# Patient Record
Sex: Female | Born: 1985 | Race: Black or African American | Hispanic: No | Marital: Single | Smoking: Current some day smoker
Health system: Southern US, Community
[De-identification: ages and names within clinical notes are randomized; demographics above are authoritative.]

## PROBLEM LIST (undated history)

## (undated) DIAGNOSIS — I10 Essential (primary) hypertension: Secondary | ICD-10-CM

## (undated) HISTORY — PX: TUBAL LIGATION: SHX77

---
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
## 2006-03-03 ENCOUNTER — Emergency Department: Payer: Self-pay | Admitting: Emergency Medicine

## 2007-01-06 ENCOUNTER — Emergency Department: Payer: Self-pay | Admitting: Emergency Medicine

## 2007-11-05 ENCOUNTER — Emergency Department: Payer: Self-pay | Admitting: Emergency Medicine

## 2008-05-15 ENCOUNTER — Emergency Department: Payer: Self-pay | Admitting: Emergency Medicine

## 2008-09-04 ENCOUNTER — Ambulatory Visit: Payer: Self-pay | Admitting: Advanced Practice Midwife

## 2008-10-23 ENCOUNTER — Observation Stay: Payer: Self-pay

## 2008-12-04 ENCOUNTER — Observation Stay: Payer: Self-pay | Admitting: Obstetrics and Gynecology

## 2008-12-06 ENCOUNTER — Observation Stay: Payer: Self-pay

## 2009-01-09 ENCOUNTER — Observation Stay: Payer: Self-pay

## 2009-01-21 ENCOUNTER — Observation Stay: Payer: Self-pay

## 2009-01-22 ENCOUNTER — Inpatient Hospital Stay: Payer: Self-pay

## 2009-07-21 ENCOUNTER — Emergency Department: Payer: Self-pay | Admitting: Internal Medicine

## 2009-11-24 ENCOUNTER — Inpatient Hospital Stay: Payer: Self-pay

## 2010-01-10 ENCOUNTER — Ambulatory Visit: Payer: Self-pay | Admitting: Obstetrics and Gynecology

## 2010-01-15 ENCOUNTER — Ambulatory Visit: Payer: Self-pay | Admitting: Obstetrics and Gynecology

## 2011-06-08 ENCOUNTER — Emergency Department: Payer: Self-pay | Admitting: Emergency Medicine

## 2011-10-27 ENCOUNTER — Emergency Department: Payer: Self-pay | Admitting: Emergency Medicine

## 2012-05-15 ENCOUNTER — Emergency Department: Payer: Self-pay | Admitting: Emergency Medicine

## 2012-05-15 LAB — CBC WITH DIFFERENTIAL/PLATELET
Basophil #: 0 10*3/uL (ref 0.0–0.1)
Basophil %: 0.4 %
Eosinophil #: 0.1 10*3/uL (ref 0.0–0.7)
Eosinophil %: 0.5 %
MCHC: 33.2 g/dL (ref 32.0–36.0)
Monocyte #: 0.4 x10 3/mm (ref 0.2–0.9)
Neutrophil #: 11.2 10*3/uL — ABNORMAL HIGH (ref 1.4–6.5)
Neutrophil %: 92.5 %
RBC: 4.58 10*6/uL (ref 3.80–5.20)
RDW: 14.7 % — ABNORMAL HIGH (ref 11.5–14.5)
WBC: 12.1 10*3/uL — ABNORMAL HIGH (ref 3.6–11.0)

## 2012-05-15 LAB — BASIC METABOLIC PANEL
BUN: 4 mg/dL — ABNORMAL LOW (ref 7–18)
Calcium, Total: 9 mg/dL (ref 8.5–10.1)
Chloride: 104 mmol/L (ref 98–107)
Co2: 24 mmol/L (ref 21–32)
EGFR (African American): >60
Osmolality: 274 (ref 275–301)
Potassium: 3.3 mmol/L — ABNORMAL LOW (ref 3.5–5.1)

## 2012-05-21 LAB — CULTURE, BLOOD (SINGLE)

## 2013-04-17 ENCOUNTER — Emergency Department: Payer: Self-pay | Admitting: Emergency Medicine

## 2014-07-19 ENCOUNTER — Emergency Department: Payer: Self-pay | Admitting: Emergency Medicine

## 2015-10-17 ENCOUNTER — Emergency Department
Admission: EM | Admit: 2015-10-17 | Discharge: 2015-10-17 | Disposition: A | Discharge status: Home / Self Care | Payer: Self-pay | Attending: Emergency Medicine | Admitting: Emergency Medicine

## 2015-10-17 ENCOUNTER — Encounter: Payer: Self-pay | Admitting: Emergency Medicine

## 2015-10-17 DIAGNOSIS — L02411 Cutaneous abscess of right axilla: Secondary | ICD-10-CM | POA: Insufficient documentation

## 2015-10-17 DIAGNOSIS — L02419 Cutaneous abscess of limb, unspecified: Secondary | ICD-10-CM

## 2015-10-17 DIAGNOSIS — Z720 Tobacco use: Secondary | ICD-10-CM | POA: Insufficient documentation

## 2015-10-17 MED ORDER — OXYCODONE HCL 5 MG PO TABS
5.0000 mg | ORAL_TABLET | Freq: Once | ORAL | Status: AC
  Administered 2015-10-17: 5 mg via ORAL
  Filled 2015-10-17: qty 1

## 2015-10-17 MED ORDER — HYDROCODONE-ACETAMINOPHEN 5-325 MG PO TABS: 1.0000 | ORAL_TABLET | ORAL | Status: DC | PRN

## 2015-10-17 MED ORDER — SULFAMETHOXAZOLE-TRIMETHOPRIM 800-160 MG PO TABS: 1.0000 | ORAL_TABLET | Freq: Two times a day (BID) | ORAL | Status: DC

## 2015-10-17 MED ORDER — DIAZEPAM 2 MG PO TABS
2.0000 mg | ORAL_TABLET | Freq: Once | ORAL | Status: AC
  Administered 2015-10-17: 2 mg via ORAL
  Filled 2015-10-17: qty 1

## 2015-10-17 MED ORDER — LIDOCAINE-EPINEPHRINE (PF) 1 %-1:200000 IJ SOLN
30.0000 mL | Freq: Once | INTRAMUSCULAR | Status: AC
  Administered 2015-10-17: 30 mL
  Filled 2015-10-17: qty 30

## 2015-10-17 NOTE — ED Provider Notes (Signed)
[facility] Emergency Department Provider Note ____________________________________________  Time seen: Approximately 4:59 PM  I have reviewed the triage vital signs and the nursing notes.   HISTORY  Chief Complaint Abscess   HPI ARLOA PRAK is a 29 y.o. female who presents to the emergency department for evaluation of abscess under right axilla. Symptoms have been present for 2 days. She reports a history of the same. She denies fever.  History reviewed. No pertinent past medical history.  There are no active problems to display for this patient.   History reviewed. No pertinent past surgical history.  Current Outpatient Rx  Name  Route  Sig  Dispense  Refill  . HYDROcodone-acetaminophen (NORCO/VICODIN) 5-325 MG tablet   Oral   Take 1 tablet by mouth every 4 (four) hours as needed.   12 tablet   0   . sulfamethoxazole-trimethoprim (BACTRIM DS,SEPTRA DS) 800-160 MG tablet   Oral   Take 1 tablet by mouth 2 (two) times daily.   20 tablet   0     Allergies Review of patient's allergies indicates no known allergies.  No family history on file.  Social History Social History  Substance Use Topics  . Smoking status: Current Some Day Smoker  . Smokeless tobacco: None  . Alcohol Use: No    Review of Systems   Constitutional: No fever/chills Eyes: No visual changes. ENT: No congestion or rhinorrhea Cardiovascular: Denies chest pain. Respiratory: Denies shortness of breath. Gastrointestinal: No abdominal pain.  No nausea, no vomiting.  No diarrhea.  No constipation. Genitourinary: Negative for dysuria. Musculoskeletal: Negative for back pain. Skin: Tender, swollen area under right arm. Neurological: Negative for headaches, focal weakness or numbness.  10-point ROS otherwise negative.  ____________________________________________   PHYSICAL EXAM:  VITAL SIGNS: ED Triage Vitals  Enc Vitals Group     BP 10/17/15 1529 155/101  mmHg     Pulse Rate 10/17/15 1529 96     Resp 10/17/15 1529 20     Temp --      Temp src --      SpO2 10/17/15 1529 98 %     Weight 10/17/15 1529 230 lb (104.327 kg)     Height 10/17/15 1529  (1.676 m)     Head Cir --      Peak Flow --      Pain Score 10/17/15 1530 8     Pain Loc --      Pain Edu? --      Excl. in GC? --     Constitutional: Alert and oriented. Well appearing and in no acute distress. Eyes: Conjunctivae are normal. PERRL. EOMI. Head: Atraumatic. Nose: No congestion/rhinnorhea. Mouth/Throat: Mucous membranes are moist.  Oropharynx non-erythematous. No oral lesions. Neck: No stridor. Cardiovascular: Normal rate, regular rhythm.  Good peripheral circulation. Respiratory: Normal respiratory effort.  No retractions. Lungs CTAB. Gastrointestinal: Soft and nontender. No distention. No abdominal bruits.  Musculoskeletal: No lower extremity tenderness nor edema.  No joint effusions. Neurologic:  Normal speech and language. No gross focal neurologic deficits are appreciated. Speech is normal. No gait instability. Skin:  Tender, fluctuant mass on right axilla without induration; Negative for petechiae.  Psychiatric: Mood and affect are normal. Speech and behavior are normal.  ____________________________________________   LABS (all labs ordered are listed, but only abnormal results are displayed)  Labs Reviewed - No data to display ____________________________________________  EKG   ____________________________________________  RADIOLOGY   ____________________________________________   PROCEDURES  Procedure(s) performed:  INCISION AND DRAINAGE Performed by: Kem Boroughsari Braley Luckenbaugh Consent: Verbal consent obtained. Risks and benefits: risks, benefits and alternatives were discussed Type: abscess  Body area: right axilla  Anesthesia: local infiltration  Incision was made with a scalpel.  Local anesthetic: lidocaine 1% with epinephrine  Anesthetic total:  6 ml  Complexity: complex  Blunt dissection to break up loculations  Drainage: purulent  Drainage amount: Large  Packing material: 1/4 in iodoform gauze  Patient tolerance: Patient tolerated the procedure well with no immediate complications.    ____________________________________________   INITIAL IMPRESSION / ASSESSMENT AND PLAN / ED COURSE  Pertinent labs & imaging results that were available during my care of the patient were reviewed by me and considered in my medical decision making (see chart for details).  Patient was advised to remove the packing in 2 days if she feels better. She was advised to leave it in place and return for a recheck if not. She was prescribed Bactrim and Norco.  She was advised to follow up with primary care to have her blood pressure rechecked when no longer in pain or return to the ER sooner for symptoms of concern. ____________________________________________   FINAL CLINICAL IMPRESSION(S) / ED DIAGNOSES  Final diagnoses:  Abscess of axillary region       Chinita PesterCari B Zahirah Cheslock, FNP 10/17/15 1931  Arnaldo NatalPaul F Malinda, MD 10/17/15 2258

## 2015-10-17 NOTE — Discharge Instructions (Signed)

## 2015-10-17 NOTE — ED Notes (Signed)
Abscess to right axilla, hx of same.

## 2015-10-17 NOTE — ED Notes (Signed)
dsy to right arm pit

## 2016-02-16 ENCOUNTER — Emergency Department
Admission: EM | Admit: 2016-02-16 | Discharge: 2016-02-16 | Disposition: A | Discharge status: Home / Self Care | Payer: Self-pay | Attending: Emergency Medicine | Admitting: Emergency Medicine

## 2016-02-16 ENCOUNTER — Encounter: Payer: Self-pay | Admitting: Emergency Medicine

## 2016-02-16 DIAGNOSIS — F17200 Nicotine dependence, unspecified, uncomplicated: Secondary | ICD-10-CM | POA: Insufficient documentation

## 2016-02-16 DIAGNOSIS — R197 Diarrhea, unspecified: Secondary | ICD-10-CM | POA: Insufficient documentation

## 2016-02-16 DIAGNOSIS — Z3202 Encounter for pregnancy test, result negative: Secondary | ICD-10-CM | POA: Insufficient documentation

## 2016-02-16 DIAGNOSIS — R112 Nausea with vomiting, unspecified: Secondary | ICD-10-CM

## 2016-02-16 DIAGNOSIS — G43809 Other migraine, not intractable, without status migrainosus: Secondary | ICD-10-CM | POA: Insufficient documentation

## 2016-02-16 DIAGNOSIS — Z792 Long term (current) use of antibiotics: Secondary | ICD-10-CM | POA: Insufficient documentation

## 2016-02-16 LAB — COMPREHENSIVE METABOLIC PANEL
ALBUMIN: 4.3 g/dL (ref 3.5–5.0)
ALT: 22 U/L (ref 14–54)
AST: 21 U/L (ref 15–41)
Alkaline Phosphatase: 92 U/L (ref 38–126)
Anion gap: 5 (ref 5–15)
BUN: 6 mg/dL (ref 6–20)
CHLORIDE: 106 mmol/L (ref 101–111)
CO2: 26 mmol/L (ref 22–32)
CREATININE: 0.79 mg/dL (ref 0.44–1.00)
Calcium: 8.8 mg/dL — ABNORMAL LOW (ref 8.9–10.3)
GFR calc Af Amer: >60 mL/min (ref >60)
GLUCOSE: 102 mg/dL — AB (ref 65–99)
Potassium: 2.8 mmol/L — CL (ref 3.5–5.1)
SODIUM: 137 mmol/L (ref 135–145)
Total Bilirubin: 0.6 mg/dL (ref 0.3–1.2)
Total Protein: 7.9 g/dL (ref 6.5–8.1)

## 2016-02-16 LAB — POCT PREGNANCY, URINE: PREG TEST UR: NEGATIVE

## 2016-02-16 LAB — CBC
HCT: 35.3 % (ref 35.0–47.0)
Hemoglobin: 11.6 g/dL — ABNORMAL LOW (ref 12.0–16.0)
MCH: 25.3 pg — AB (ref 26.0–34.0)
MCHC: 32.9 g/dL (ref 32.0–36.0)
MCV: 77 fL — AB (ref 80.0–100.0)
PLATELETS: 428 10*3/uL (ref 150–440)
RBC: 4.59 MIL/uL (ref 3.80–5.20)
RDW: 18.3 % — AB (ref 11.5–14.5)
WBC: 4.2 10*3/uL (ref 3.6–11.0)

## 2016-02-16 LAB — URINALYSIS COMPLETE WITH MICROSCOPIC (ARMC ONLY)
Bilirubin Urine: NEGATIVE
GLUCOSE, UA: NEGATIVE mg/dL
Nitrite: NEGATIVE
PROTEIN: 100 mg/dL — AB
Specific Gravity, Urine: 1.03 (ref 1.005–1.030)
pH: 5 (ref 5.0–8.0)

## 2016-02-16 LAB — LIPASE, BLOOD: LIPASE: 17 U/L (ref 11–51)

## 2016-02-16 MED ORDER — KETOROLAC TROMETHAMINE 30 MG/ML IJ SOLN
30.0000 mg | Freq: Once | INTRAMUSCULAR | Status: AC
  Administered 2016-02-16: 30 mg via INTRAVENOUS
  Filled 2016-02-16: qty 1

## 2016-02-16 MED ORDER — SODIUM CHLORIDE 0.9 % IV BOLUS (SEPSIS)
1000.0000 mL | Freq: Once | INTRAVENOUS | Status: AC
Start: 2016-02-16 — End: 2016-02-16
  Administered 2016-02-16: 1000 mL via INTRAVENOUS

## 2016-02-16 MED ORDER — POTASSIUM CHLORIDE ER 10 MEQ PO CPCR: 10.0000 meq | ORAL_CAPSULE | Freq: Once | ORAL | Status: DC

## 2016-02-16 MED ORDER — PROCHLORPERAZINE EDISYLATE 5 MG/ML IJ SOLN
10.0000 mg | Freq: Once | INTRAMUSCULAR | Status: AC
  Administered 2016-02-16: 10 mg via INTRAVENOUS
  Filled 2016-02-16: qty 2

## 2016-02-16 MED ORDER — METOCLOPRAMIDE HCL 10 MG PO TABS: 10.0000 mg | ORAL_TABLET | Freq: Four times a day (QID) | ORAL | Status: DC | PRN

## 2016-02-16 MED ORDER — DIPHENHYDRAMINE HCL 50 MG/ML IJ SOLN
25.0000 mg | Freq: Once | INTRAMUSCULAR | Status: AC
  Administered 2016-02-16: 25 mg via INTRAVENOUS
  Filled 2016-02-16: qty 1

## 2016-02-16 MED ORDER — POTASSIUM CHLORIDE CRYS ER 20 MEQ PO TBCR
40.0000 meq | EXTENDED_RELEASE_TABLET | Freq: Once | ORAL | Status: AC
  Administered 2016-02-16: 40 meq via ORAL
  Filled 2016-02-16: qty 2

## 2016-02-16 NOTE — Discharge Instructions (Signed)

## 2016-02-16 NOTE — ED Provider Notes (Addendum)
[facility] Emergency Department Provider Note  ____________________________________________  Time seen: Approximately 1 PM  I have reviewed the triage vital signs and the nursing notes.   HISTORY  Chief Complaint Emesis    HPI Brittany Graves is a 30 y.o. female without any chronic medical problems was presenting today with 1 week of nausea, vomiting diarrhea as well as right-sided headache. She says that she is a known sick contact of one of her children who also had a recent "virus." She says that she has vomited 3-4 times a day with some blood streaks mixed in. She also says that she has had several episodes of diarrhea per day without any blood visualized. No recent foreign travel or antibiotics. Denies any abdominal pain. Also complaining of a right-sided headache which she describes now as a 7-8 out of a 10 and stabbing. She says that she is also seeing some black spots earlier today. Denies any neck pain. Says that she has had multiple headaches with similar characteristics in the past. Says that she is also seeing a lump to the right side of her forehead which she also thinks is new over the past week. She says it is tender to touch.   History reviewed. No pertinent past medical history.  There are no active problems to display for this patient.   History reviewed. No pertinent past surgical history.  Current Outpatient Rx  Name  Route  Sig  Dispense  Refill  . HYDROcodone-acetaminophen (NORCO/VICODIN) 5-325 MG tablet   Oral   Take 1 tablet by mouth every 4 (four) hours as needed.   12 tablet   0   . sulfamethoxazole-trimethoprim (BACTRIM DS,SEPTRA DS) 800-160 MG tablet   Oral   Take 1 tablet by mouth 2 (two) times daily.   20 tablet   0     Allergies Review of patient's allergies indicates no known allergies.  History reviewed. No pertinent family history.  Social History Social History  Substance Use Topics  . Smoking status:  Current Some Day Smoker  . Smokeless tobacco: None  . Alcohol Use: No    Review of Systems Constitutional: No fever/chills Eyes: No visual changes. ENT: No sore throat. Cardiovascular: Denies chest pain. Respiratory: Denies shortness of breath. Gastrointestinal: No abdominal pain.   No constipation. Genitourinary: Negative for dysuria. Musculoskeletal: Negative for back pain. Skin: Negative for rash. Neurological: Negative for focal weakness or numbness.  10-point ROS otherwise negative.  ____________________________________________   PHYSICAL EXAM:  VITAL SIGNS: ED Triage Vitals  Enc Vitals Group     BP 02/16/16 1201 136/100 mmHg     Pulse Rate 02/16/16 1201 99     Resp 02/16/16 1201 18     Temp 02/16/16 1201 98.4 F (36.9 C)     Temp Source 02/16/16 1201 Oral     SpO2 02/16/16 1201 100 %     Weight 02/16/16 1201 220 lb (99.791 kg)     Height 02/16/16 1201 5\' 6"  (1.676 m)     Head Cir --      Peak Flow --      Pain Score 02/16/16 1202 8     Pain Loc --      Pain Edu? --      Excl. in GC? --     Constitutional: Alert and oriented. Well appearing and in no acute distress. Eyes: Conjunctivae are normal. PERRL. EOMI. Head: Atraumatic. 1-2 cm round raised lesion to the right side of the forehead which is  firm and immobile. Mild tenderness without any overlying erythema, induration or pus. Nose: No congestion/rhinnorhea. Mouth/Throat: Mucous membranes are moist.  Oropharynx non-erythematous. Neck: No stridor.   Cardiovascular: Normal rate, regular rhythm. Grossly normal heart sounds.   Respiratory: Normal respiratory effort.  No retractions. Lungs CTAB. Gastrointestinal: Soft and nontender. No distention. No abdominal bruits. No CVA tenderness. Musculoskeletal: No lower extremity tenderness nor edema.  No joint effusions. Neurologic:  Normal speech and language. No gross focal neurologic deficits are appreciated.  Skin:  Skin is warm, dry and intact. No rash  noted. Psychiatric: Mood and affect are normal. Speech and behavior are normal.  ____________________________________________   LABS (all labs ordered are listed, but only abnormal results are displayed)  Labs Reviewed  COMPREHENSIVE METABOLIC PANEL - Abnormal; Notable for the following:    Potassium 2.8 (*)    Glucose, Bld 102 (*)    Calcium 8.8 (*)    All other components within normal limits  CBC - Abnormal; Notable for the following:    Hemoglobin 11.6 (*)    MCV 77.0 (*)    MCH 25.3 (*)    RDW 18.3 (*)    All other components within normal limits  URINALYSIS COMPLETEWITH MICROSCOPIC ([facility] ONLY) - Abnormal; Notable for the following:    Color, Urine AMBER (*)    APPearance TURBID (*)    Ketones, ur TRACE (*)    Hgb urine dipstick 3+ (*)    Protein, ur 100 (*)    Leukocytes, UA 2+ (*)    Bacteria, UA FEW (*)    Squamous Epithelial / LPF TOO NUMEROUS TO COUNT (*)    All other components within normal limits  LIPASE, BLOOD  POC URINE PREG, ED  POCT PREGNANCY, URINE   ____________________________________________  EKG   ____________________________________________  RADIOLOGY   ____________________________________________   PROCEDURES    ____________________________________________   INITIAL IMPRESSION / ASSESSMENT AND PLAN / ED COURSE  Pertinent labs & imaging results that were available during my care of the patient were reviewed by me and considered in my medical decision making (see chart for details).  ----------------------------------------- 3:49 PM on 02/16/2016 -----------------------------------------  Patient says that her headache pain is relieved. She is awake alert and eating fried chicken at this time. No episodes of nausea or vomiting in the emergency department. She'll be discharged home with a prescription for Reglan as well as an additional potassium TAB. Feel the symptoms are likely viral. Urine likely contaminated. Patient denies any  burning or frequency. We'll send for culture. ____________________________________________   FINAL CLINICAL IMPRESSION(S) / ED DIAGNOSES  Nausea vomiting and diarrhea. Migraine headache.    Myrna Blazer, MD 02/16/16 1550  Furthermore, regarding the bump on the patient's head, we discussed observing this to make sure it does not continue to grow. We discussed possible causes for this including bony cancer. We discussed CAT scan for a lump at this time but the patient would not like this is the additional cost and the fact that she medial to follow up with this as an outpatient. I do not see this acute cause of the patient's illness at this time. She is feeling better after symptomatic treatment. She will be discharged home.  Myrna Blazer, MD 02/16/16 1551  Pt appears to be hypertensive when in the emergency department.  Will need further follow up at her outpatient appointment for a re check.    Myrna Blazer, MD 02/16/16 [phone]

## 2016-02-16 NOTE — ED Notes (Signed)
N/v/d x 1 week. Last emesis this am, loose stools this am

## 2016-02-16 NOTE — ED Notes (Signed)
Pt given warm blanket.

## 2016-02-16 NOTE — ED Notes (Signed)
Patient's mother brought [employer] to the patient for a PO challenge.

## 2016-02-18 LAB — URINE CULTURE

## 2016-03-14 ENCOUNTER — Emergency Department
Admission: EM | Admit: 2016-03-14 | Discharge: 2016-03-14 | Disposition: A | Discharge status: Home / Self Care | Payer: Self-pay | Attending: Emergency Medicine | Admitting: Emergency Medicine

## 2016-03-14 ENCOUNTER — Encounter: Payer: Self-pay | Admitting: *Deleted

## 2016-03-14 DIAGNOSIS — F17200 Nicotine dependence, unspecified, uncomplicated: Secondary | ICD-10-CM | POA: Insufficient documentation

## 2016-03-14 DIAGNOSIS — Z5321 Procedure and treatment not carried out due to patient leaving prior to being seen by health care provider: Secondary | ICD-10-CM | POA: Insufficient documentation

## 2016-03-14 DIAGNOSIS — Z79899 Other long term (current) drug therapy: Secondary | ICD-10-CM | POA: Insufficient documentation

## 2016-03-14 LAB — COMPREHENSIVE METABOLIC PANEL
ALT: 20 U/L (ref 14–54)
AST: 20 U/L (ref 15–41)
Albumin: 4 g/dL (ref 3.5–5.0)
Alkaline Phosphatase: 89 U/L (ref 38–126)
Anion gap: 4 — ABNORMAL LOW (ref 5–15)
BILIRUBIN TOTAL: 0.2 mg/dL — AB (ref 0.3–1.2)
BUN: 7 mg/dL (ref 6–20)
CO2: 24 mmol/L (ref 22–32)
Calcium: 8.7 mg/dL — ABNORMAL LOW (ref 8.9–10.3)
Chloride: 108 mmol/L (ref 101–111)
Creatinine, Ser: 0.59 mg/dL (ref 0.44–1.00)
GFR calc Af Amer: >60 mL/min (ref >60)
Glucose, Bld: 89 mg/dL (ref 65–99)
POTASSIUM: 3.3 mmol/L — AB (ref 3.5–5.1)
Sodium: 136 mmol/L (ref 135–145)
TOTAL PROTEIN: 7.3 g/dL (ref 6.5–8.1)

## 2016-03-14 LAB — URINALYSIS COMPLETE WITH MICROSCOPIC (ARMC ONLY)
BACTERIA UA: NONE SEEN
Bilirubin Urine: NEGATIVE
GLUCOSE, UA: NEGATIVE mg/dL
Ketones, ur: NEGATIVE mg/dL
Nitrite: NEGATIVE
PROTEIN: NEGATIVE mg/dL
Specific Gravity, Urine: 1.025 (ref 1.005–1.030)
pH: 5 (ref 5.0–8.0)

## 2016-03-14 LAB — CBC
HEMATOCRIT: 32.2 % — AB (ref 35.0–47.0)
HEMOGLOBIN: 10.5 g/dL — AB (ref 12.0–16.0)
MCH: 24.7 pg — AB (ref 26.0–34.0)
MCHC: 32.5 g/dL (ref 32.0–36.0)
MCV: 76 fL — AB (ref 80.0–100.0)
Platelets: 413 10*3/uL (ref 150–440)
RBC: 4.24 MIL/uL (ref 3.80–5.20)
RDW: 18.7 % — AB (ref 11.5–14.5)
WBC: 5.5 10*3/uL (ref 3.6–11.0)

## 2016-03-14 LAB — POCT PREGNANCY, URINE: PREG TEST UR: NEGATIVE

## 2016-03-14 NOTE — ED Notes (Signed)
States vaginal bleeding and cramping that began last night, states her period is not to begin till 4/23, states hx of tubal ligation, states she just feels bad, awake and alert in no acute distress

## 2016-03-17 ENCOUNTER — Telehealth: Payer: Self-pay | Admitting: Emergency Medicine

## 2016-03-17 NOTE — ED Notes (Signed)
Called patient due to lwot to inquire about condition and follow up plans. Pt says she went to a walk in women's clinic and had to pay 125 dollars, and they just gave her pain meds.  She says she continues to pass clots and has pain.  i explained that we had done the labs, and she needs to follow up with pcp or gyn.  She does not have insurance, so I explained [facility] sliding scale.  I told her to call [facility] to see if they have a service for womens care, and price.  She agrees to call [facility] and if they do not have service she will call [facility].  She will return to the ED if she is getting worse.

## 2017-03-22 ENCOUNTER — Emergency Department
Admission: EM | Admit: 2017-03-22 | Discharge: 2017-03-22 | Disposition: A | Discharge status: Home / Self Care | Payer: Self-pay | Attending: Emergency Medicine | Admitting: Emergency Medicine

## 2017-03-22 DIAGNOSIS — Z0189 Encounter for other specified special examinations: Secondary | ICD-10-CM

## 2017-03-22 DIAGNOSIS — Z7689 Persons encountering health services in other specified circumstances: Secondary | ICD-10-CM

## 2017-03-22 DIAGNOSIS — F17200 Nicotine dependence, unspecified, uncomplicated: Secondary | ICD-10-CM | POA: Insufficient documentation

## 2017-03-22 DIAGNOSIS — L02411 Cutaneous abscess of right axilla: Secondary | ICD-10-CM | POA: Insufficient documentation

## 2017-03-22 MED ORDER — OXYCODONE-ACETAMINOPHEN 5-325 MG PO TABS
1.0000 | ORAL_TABLET | Freq: Once | ORAL | Status: AC
Start: 2017-03-22 — End: 2017-03-22
  Administered 2017-03-22: 1 via ORAL
  Filled 2017-03-22: qty 1

## 2017-03-22 MED ORDER — LIDOCAINE HCL (PF) 1 % IJ SOLN
5.0000 mL | Freq: Once | INTRAMUSCULAR | Status: DC
  Filled 2017-03-22: qty 5

## 2017-03-22 MED ORDER — HYDROCODONE-ACETAMINOPHEN 5-325 MG PO TABS: 1.0000 | ORAL_TABLET | Freq: Four times a day (QID) | ORAL | 0 refills | Status: DC | PRN

## 2017-03-22 MED ORDER — SULFAMETHOXAZOLE-TRIMETHOPRIM 800-160 MG PO TABS: 1.0000 | ORAL_TABLET | Freq: Two times a day (BID) | ORAL | 0 refills | Status: DC

## 2017-03-22 MED ORDER — SULFAMETHOXAZOLE-TRIMETHOPRIM 800-160 MG PO TABS
1.0000 | ORAL_TABLET | Freq: Once | ORAL | Status: AC
  Administered 2017-03-22: 1 via ORAL
  Filled 2017-03-22: qty 1

## 2017-03-22 NOTE — ED Provider Notes (Signed)
[facility] Emergency Department Provider Note ____________________________________________  Time seen: 1655  I have reviewed the triage vital signs and the nursing notes.  HISTORY  Chief Complaint  Abscess  HPI Brittany Graves is a 31 y.o. female visits to the ED for evaluation of an abscess to the right axilla. Patient describes right axillary cyst that has been present since yesterday. She describes increasing pain and tightness since applying warm compresses to the area.She reports similar abscess to the area in the past. She denies any fevers, chills, sweats, or spontaneous drainage.  History reviewed. No pertinent past medical history.  There are no active problems to display for this patient.  Past Surgical History:  Procedure Laterality Date  . TUBAL LIGATION      Prior to Admission medications   Medication Sig Start Date End Date Taking? Authorizing Provider  HYDROcodone-acetaminophen (NORCO) 5-325 MG tablet Take 1 tablet by mouth every 6 (six) hours as needed. 03/22/17   Hannan Tetzlaff V Bacon Sumire Halbleib, PA-C  metoCLOPramide (REGLAN) 10 MG tablet Take 1 tablet (10 mg total) by mouth every 6 (six) hours as needed. 02/16/16   Myrna Blazer, MD  potassium chloride (MICRO-K) 10 MEQ CR capsule Take 1 capsule (10 mEq total) by mouth once. 02/16/16   Myrna Blazer, MD  sulfamethoxazole-trimethoprim (BACTRIM DS,SEPTRA DS) 800-160 MG tablet Take 1 tablet by mouth 2 (two) times daily. 03/22/17   Charlesetta Ivory Buna Cuppett, PA-C    Allergies Patient has no known allergies.  Family History  Problem Relation Age of Onset  . Hypertension Mother   . Diabetes Mother     Social History Social History  Substance Use Topics  . Smoking status: Current Some Day Smoker  . Smokeless tobacco: Never Used  . Alcohol use No    Review of Systems  Constitutional: Negative for fever. Cardiovascular: Negative for chest pain. Respiratory: Negative for shortness  of breath. Skin: Negative for rash. Right axillary abscess as above. Neurological: Negative for headaches, focal weakness or numbness. ____________________________________________  PHYSICAL EXAM:  VITAL SIGNS: ED Triage Vitals  Enc Vitals Group     BP 03/22/17 1641 (!) 140/92     Pulse Rate 03/22/17 1641 99     Resp 03/22/17 1641 20     Temp 03/22/17 1641 99.2 F (37.3 C)     Temp Source 03/22/17 1641 Oral     SpO2 03/22/17 1641 100 %     Weight 03/22/17 1641 230 lb (104.3 kg)     Height 03/22/17 1641  (1.676 m)     Head Circumference --      Peak Flow --      Pain Score 03/22/17 1640 8     Pain Loc --      Pain Edu? --      Excl. in GC? --     Constitutional: Alert and oriented. Well appearing and in no distress. Head: Normocephalic and atraumatic. Cardiovascular: Normal rate, regular rhythm. Normal distal pulses. Respiratory: Normal respiratory effort.  Musculoskeletal: Nontender with normal range of motion in all extremities.  Neurologic:  Normal gait without ataxia. Normal speech and language. No gross focal neurologic deficits are appreciated. Skin:  Skin is warm, dry and intact. No rash noted. Right axilla with a well formed fluctuant, pointing abscess with local erythema. No spontaneous drainage of punctum is noted. ____________________________________________  PROCEDURES  Percocet 5-325 mg PO Bactrim DS i PO  INCISION AND DRAINAGE Performed by: Lissa Hoard Consent: Verbal  consent obtained. Risks and benefits: risks, benefits and alternatives were discussed Type: abscess  Body area: right axilla  Anesthesia: local infiltration  Incision was made with a scalpel.  Local anesthetic: lidocaine 1% w/o epinephrine  Anesthetic total: 5 ml  Complexity: complex Blunt dissection to break up loculations  Drainage: purulent  Drainage amount: moderate  Packing material: 1/4 in iodoform gauze  Patient tolerance: Patient tolerated the  procedure well with no immediate complications. ____________________________________________  INITIAL IMPRESSION / ASSESSMENT AND PLAN / ED COURSE  Patient with a right axilla abscess status post I&D procedure. She is discharged at his home prescription for Percocet and hydrocodone dose as directed. She will return to the ED in 3 days for wound check and packing removal as needed. She will continue to monitor and apply warm compresses to promote healing. She will follow up with [facility] clinic or the ED as discussed. ____________________________________________  FINAL CLINICAL IMPRESSION(S) / ED DIAGNOSES  Final diagnoses:  Abscess of axilla, right  Encounter for incision and drainage procedure      Lissa Hoard, PA-C 03/22/17 1806    Phineas Semen, MD 03/22/17 1928

## 2017-03-22 NOTE — Discharge Instructions (Signed)
Keep with wound clean, dry, and covered. Apply warm compresses to promote healing. Follow-up in 3 days for wound check as needed. Take the antibiotic until all pills are gone. Take the pain medicine as needed.

## 2017-03-22 NOTE — ED Notes (Signed)
FIRST NURSE NOTE: C/o abscess under right axilla first noticed yesterday. Painful and increased swelling

## 2017-03-22 NOTE — ED Triage Notes (Signed)
Pt to ED from home c/o abscess. Pt reports right axilla abscess that she noticed yesterday, and has gotten bigger today. 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 Pt alert and oriented in no acute distress at this time.

## 2018-02-03 ENCOUNTER — Encounter: Payer: Self-pay | Admitting: Emergency Medicine

## 2018-02-03 ENCOUNTER — Emergency Department: Payer: Medicaid Other

## 2018-02-03 ENCOUNTER — Emergency Department
Admission: EM | Admit: 2018-02-03 | Discharge: 2018-02-03 | Disposition: A | Discharge status: Home / Self Care | Payer: Medicaid Other | Attending: Emergency Medicine | Admitting: Emergency Medicine

## 2018-02-03 DIAGNOSIS — R202 Paresthesia of skin: Secondary | ICD-10-CM | POA: Insufficient documentation

## 2018-02-03 DIAGNOSIS — R200 Anesthesia of skin: Secondary | ICD-10-CM

## 2018-02-03 DIAGNOSIS — Z79899 Other long term (current) drug therapy: Secondary | ICD-10-CM | POA: Insufficient documentation

## 2018-02-03 DIAGNOSIS — F17200 Nicotine dependence, unspecified, uncomplicated: Secondary | ICD-10-CM | POA: Diagnosis not present

## 2018-02-03 DIAGNOSIS — I10 Essential (primary) hypertension: Secondary | ICD-10-CM | POA: Diagnosis present

## 2018-02-03 LAB — CBC
HCT: 34.1 % — ABNORMAL LOW (ref 35.0–47.0)
HEMOGLOBIN: 10.8 g/dL — AB (ref 12.0–16.0)
MCH: 23.3 pg — ABNORMAL LOW (ref 26.0–34.0)
MCHC: 31.6 g/dL — AB (ref 32.0–36.0)
MCV: 73.8 fL — ABNORMAL LOW (ref 80.0–100.0)
Platelets: 483 10*3/uL — ABNORMAL HIGH (ref 150–440)
RBC: 4.62 MIL/uL (ref 3.80–5.20)
RDW: 17.5 % — ABNORMAL HIGH (ref 11.5–14.5)
WBC: 6.3 10*3/uL (ref 3.6–11.0)

## 2018-02-03 LAB — BASIC METABOLIC PANEL
ANION GAP: 7 (ref 5–15)
BUN: 7 mg/dL (ref 6–20)
CALCIUM: 8.7 mg/dL — AB (ref 8.9–10.3)
CO2: 23 mmol/L (ref 22–32)
Chloride: 106 mmol/L (ref 101–111)
Creatinine, Ser: 0.64 mg/dL (ref 0.44–1.00)
GFR calc Af Amer: >60 mL/min (ref >60)
GLUCOSE: 100 mg/dL — AB (ref 65–99)
Potassium: 3.7 mmol/L (ref 3.5–5.1)
Sodium: 136 mmol/L (ref 135–145)

## 2018-02-03 LAB — TROPONIN I

## 2018-02-03 MED ORDER — HYDROCHLOROTHIAZIDE 25 MG PO TABS: 25.0000 mg | ORAL_TABLET | Freq: Every day | ORAL | 1 refills | Status: DC

## 2018-02-03 MED ORDER — METOCLOPRAMIDE HCL 10 MG PO TABS
10.0000 mg | ORAL_TABLET | Freq: Once | ORAL | Status: AC
  Administered 2018-02-03: 10 mg via ORAL
  Filled 2018-02-03: qty 1

## 2018-02-03 MED ORDER — ACETAMINOPHEN 325 MG PO TABS
650.0000 mg | ORAL_TABLET | Freq: Once | ORAL | Status: AC
  Administered 2018-02-03: 650 mg via ORAL
  Filled 2018-02-03: qty 2

## 2018-02-03 NOTE — ED Provider Notes (Signed)
[facility] Emergency Department Provider Note ____________________________________________   First MD Initiated Contact with Patient 02/03/18 1313     (approximate)  I have reviewed the triage vital signs and the nursing notes.   HISTORY  Chief Complaint Chest Pain    HPI Brittany Graves is a 32 y.o. female with past medical history of hypertension (not on any medication) who presents with hypertension, measured to as high as 160/110 at home, unknown onset but present for at least the last week, and associated with several subacute symptoms.  The patient primarily reports right-sided facial decreased sensation for at least the last several weeks, which is constant and has no aggravating or alleviating factors.  She reports some intermittent blurred vision, but denies any speech disturbance, weakness or numbness throughout her body, facial droop, or other acute symptoms.  The patient also reports some chest pain last week which is now resolved, and a frontal headache which has been present intermittently over several weeks.  The patient states that she tried to go to [facility] today, but was unable to get an appointment until April so was told to come to the emergency department.  She states she was previously told she has high blood pressure, but has never started treatment for it.   History reviewed. No pertinent past medical history.  There are no active problems to display for this patient.   Past Surgical History:  Procedure Laterality Date  . TUBAL LIGATION      Prior to Admission medications   Medication Sig Start Date End Date Taking? Authorizing Provider  hydrochlorothiazide (HYDRODIURIL) 25 MG tablet Take 1 tablet (25 mg total) by mouth daily. 02/03/18 04/04/18  Dionne Bucy, MD  HYDROcodone-acetaminophen (NORCO) 5-325 MG tablet Take 1 tablet by mouth every 6 (six) hours as needed. Patient not taking: Reported on 02/03/2018 03/22/17    Menshew, Charlesetta Ivory, PA-C  metoCLOPramide (REGLAN) 10 MG tablet Take 1 tablet (10 mg total) by mouth every 6 (six) hours as needed. Patient not taking: Reported on 02/03/2018 02/16/16   Schaevitz, Myra Rude, MD  potassium chloride (MICRO-K) 10 MEQ CR capsule Take 1 capsule (10 mEq total) by mouth once. 02/16/16   Schaevitz, Myra Rude, MD  sulfamethoxazole-trimethoprim (BACTRIM DS,SEPTRA DS) 800-160 MG tablet Take 1 tablet by mouth 2 (two) times daily. Patient not taking: Reported on 02/03/2018 03/22/17   Menshew, Charlesetta Ivory, PA-C    Allergies Patient has no known allergies.  Family History  Problem Relation Age of Onset  . Hypertension Mother   . Diabetes Mother     Social History Social History   Tobacco Use  . Smoking status: Current Some Day Smoker  . Smokeless tobacco: Never Used  Substance Use Topics  . Alcohol use: No  . Drug use: Not on file    Review of Systems  Constitutional: No fever/chills. Eyes: Positive for intermittent blurred vision. ENT: No neck pain. Cardiovascular: Positive for resolved chest pain. Respiratory: Denies shortness of breath. Gastrointestinal: No nausea, no vomiting.  No diarrhea.  Genitourinary: Negative for dysuria.  Musculoskeletal: Negative for back pain. Skin: Negative for rash. Neurological: Positive for headache.   ____________________________________________   PHYSICAL EXAM:  VITAL SIGNS: ED Triage Vitals  Enc Vitals Group     BP 02/03/18 1006 (!) 145/94     Pulse Rate 02/03/18 1006 95     Resp 02/03/18 1006 16     Temp 02/03/18 1006 99 F (37.2 C)  Temp Source 02/03/18 1006 Oral     SpO2 02/03/18 1006 100 %     Weight 02/03/18 1004 245 lb (111.1 kg)     Height 02/03/18 1004 5\' 6"  (1.676 m)     Head Circumference --      Peak Flow --      Pain Score 02/03/18 1004 8     Pain Loc --      Pain Edu? --      Excl. in GC? --     Constitutional: Alert and oriented. Well appearing and in no acute  distress. Eyes: Conjunctivae are normal.  EOMI.  PERRLA. Head: Atraumatic. Nose: No congestion/rhinnorhea. Mouth/Throat: Mucous membranes are moist.   Neck: Normal range of motion.  Cardiovascular: Normal rate, regular rhythm. Grossly normal heart sounds.  Good peripheral circulation. Respiratory: Normal respiratory effort.  No retractions. Lungs CTAB. Gastrointestinal: No distention.  Musculoskeletal: No lower extremity edema.  Extremities warm and well perfused.  Neurologic:  Normal speech and language.  Possible slightly decreased sensation subjectively in the right face in all 3 distributions.  No facial droop or other motor deficit.  Remainder of the neuro exam normal with motor and sensory intact in all extremities, normal coordination with no ataxia, and other cranial nerves intact. Skin:  Skin is warm and dry. No rash noted. Psychiatric: Mood and affect are normal. Speech and behavior are normal.  ____________________________________________   LABS (all labs ordered are listed, but only abnormal results are displayed)  Labs Reviewed  BASIC METABOLIC PANEL - Abnormal; Notable for the following components:      Result Value   Glucose, Bld 100 (*)    Calcium 8.7 (*)    All other components within normal limits  CBC - Abnormal; Notable for the following components:   Hemoglobin 10.8 (*)    HCT 34.1 (*)    MCV 73.8 (*)    MCH 23.3 (*)    MCHC 31.6 (*)    RDW 17.5 (*)    Platelets 483 (*)    All other components within normal limits  TROPONIN I   ____________________________________________  EKG  ED ECG REPORT I, Dionne Bucy, the attending physician, personally viewed and interpreted this ECG.  Date: 02/03/2018 EKG Time: 1004 Rate: 98 Rhythm: normal sinus rhythm QRS Axis: normal Intervals: normal ST/T Wave abnormalities: normal Narrative Interpretation: no evidence of acute ischemia  ____________________________________________  RADIOLOGY  CT head: No  acute findings  ____________________________________________   PROCEDURES  Procedure(s) performed: No  Procedures  Critical Care performed: No ____________________________________________   INITIAL IMPRESSION / ASSESSMENT AND PLAN / ED COURSE  Pertinent labs & imaging results that were available during my care of the patient were reviewed by me and considered in my medical decision making (see chart for details).  32 year old female with history of hypertension that she has not yet been treated for presents with elevated blood pressure readings at home, as well as with various intermittent subacute symptoms including right facial decreased sensation, an episode of chest pain last week, and relatively mild frontal headache.  On exam, the vital signs are normal except for hypertension.  Neuro exam is nonfocal except for the subjective facial numbness as described above, and the remainder the exam is unremarkable.  1.  Hypertension: Patient's blood pressure is elevated today, and she states she has had similar readings in the past.  At this time, there is no clinical evidence for end organ dysfunction.  We will obtain basic labs, troponin x1, and  since the patient cannot follow-up with primary care until April I will start her on an antihypertensive.  2.  Neuro symptoms: Patient has subjective decreased sensation in the right side of her face which is present in all 3 distributions of the trigeminal nerve, and is not consistent with acute stroke or other CNS cause.  Suspect some type of facial neuropathy or other benign cause.  Also could be hypertension related although this is less likely.  We will obtain a CT to rule out subacute stroke although this would be unlikely.  If negative, anticipate discharge home with PMD and possible neurology follow-up.  3.  Chest pain: Chest pain was relatively short-lived, present last week, now resolved.  No chest pain currently.  EKG is normal, and  initial troponin is negative.  No indication for other emergent workup.  ----------------------------------------- 2:22 PM on 02/03/2018 -----------------------------------------  CT is negative.  Patient reports some improvement in the headache.  As planned, I will discharge with a prescription for hydrochlorothiazide.  Patient has follow-up plan.  I also made a referral to neurology for further evaluation of the facial numbness.  Return precautions given, the patient expresses understanding.     ____________________________________________   FINAL CLINICAL IMPRESSION(S) / ED DIAGNOSES  Final diagnoses:  Hypertension, unspecified type  Numbness and tingling of right face      NEW MEDICATIONS STARTED DURING THIS VISIT:  New Prescriptions   HYDROCHLOROTHIAZIDE (HYDRODIURIL) 25 MG TABLET    Take 1 tablet (25 mg total) by mouth daily.     Note:  This document was prepared using Dragon voice recognition software and may include unintentional dictation errors.    Dionne BucySiadecki, Townes Fuhs, MD 02/03/18 [phone]

## 2018-02-03 NOTE — ED Notes (Signed)
First Nurse Note:  Patient to triage for EKG.

## 2018-02-03 NOTE — ED Notes (Signed)
Patient transported to CT 

## 2018-02-03 NOTE — ED Notes (Signed)
First Nurse Note:  From [facility] via [facility] with chest pain X 1 week and stiff neck on right side X 1 week.  Seen at [facility] on Monday and told she had high blood pressure.

## 2018-02-03 NOTE — ED Triage Notes (Signed)
[facility]. Patient reports hypertension, blurry vision, chest pain and left sided facial numbness x "a few weeks on and off". FAST screen negative. Even and non labored respirations noted.

## 2018-02-03 NOTE — Discharge Instructions (Signed)
The blood pressure medication as prescribed.   follow-up with your primary care in April as scheduled.  Return to the emergency department for new, worsening, or persistent severe high blood pressure, headache, numbness, weakness, chest pain, difficulty breathing, or any other new or worsening symptoms that concern you.  We have also provided a referral to a neurologist for further evaluation of the facial numbness or expensing.

## 2019-01-20 ENCOUNTER — Emergency Department
Admission: EM | Admit: 2019-01-20 | Discharge: 2019-01-20 | Disposition: A | Discharge status: Home / Self Care | Payer: Medicaid Other | Attending: Emergency Medicine | Admitting: Emergency Medicine

## 2019-01-20 ENCOUNTER — Encounter: Payer: Self-pay | Admitting: Emergency Medicine

## 2019-01-20 DIAGNOSIS — F17210 Nicotine dependence, cigarettes, uncomplicated: Secondary | ICD-10-CM | POA: Insufficient documentation

## 2019-01-20 DIAGNOSIS — I10 Essential (primary) hypertension: Secondary | ICD-10-CM | POA: Insufficient documentation

## 2019-01-20 DIAGNOSIS — L0291 Cutaneous abscess, unspecified: Secondary | ICD-10-CM

## 2019-01-20 DIAGNOSIS — Z79899 Other long term (current) drug therapy: Secondary | ICD-10-CM | POA: Insufficient documentation

## 2019-01-20 DIAGNOSIS — N611 Abscess of the breast and nipple: Secondary | ICD-10-CM | POA: Insufficient documentation

## 2019-01-20 HISTORY — DX: Essential (primary) hypertension: I10

## 2019-01-20 MED ORDER — SULFAMETHOXAZOLE-TRIMETHOPRIM 800-160 MG PO TABS
1.0000 | ORAL_TABLET | Freq: Once | ORAL | Status: AC
  Administered 2019-01-20: 1 via ORAL
  Filled 2019-01-20: qty 1

## 2019-01-20 MED ORDER — HYDROCODONE-ACETAMINOPHEN 5-325 MG PO TABS
1.0000 | ORAL_TABLET | Freq: Once | ORAL | Status: AC
  Administered 2019-01-20: 1 via ORAL
  Filled 2019-01-20: qty 1

## 2019-01-20 MED ORDER — SULFAMETHOXAZOLE-TRIMETHOPRIM 800-160 MG PO TABS: 1.0000 | ORAL_TABLET | Freq: Two times a day (BID) | ORAL | 0 refills | Status: AC

## 2019-01-20 MED ORDER — LIDOCAINE HCL (PF) 1 % IJ SOLN
5.0000 mL | Freq: Once | INTRAMUSCULAR | Status: AC
  Administered 2019-01-20: 5 mL
  Filled 2019-01-20: qty 5

## 2019-01-20 NOTE — ED Triage Notes (Signed)
Pt reports abscess on her right breast since Tuesday. Pt reports it hurts, denies fevers. Pt states that it did drain a little bit.

## 2019-01-20 NOTE — ED Triage Notes (Signed)
Pt reports hx of HTN but states she has been out of her meds for 20 days.

## 2019-01-20 NOTE — Discharge Instructions (Signed)
Keep the area clean, dry, and covered. Apply warm compresses over the dressing to promote healing. You may remove the packing in 3 days. Take the antibiotic as directed. Take OTC Tylenol + Motrin for pain.

## 2019-01-20 NOTE — ED Provider Notes (Signed)
[facility] Emergency Department Provider Note ____________________________________________  Time seen: 1013  I have reviewed the triage vital signs and the nursing notes.  HISTORY  Chief Complaint  Abscess  HPI Brittany Graves is a 33 y.o. female presents to the ED for evaluation of a breast abscess. She reports spontaneous drainage from the same area. She noted the area on Tuesday. She denies fevers, chills, or nausea. She has had previous abscesses in the past.   Past Medical History:  Diagnosis Date  . Hypertension     There are no active problems to display for this patient.   Past Surgical History:  Procedure Laterality Date  . TUBAL LIGATION      Prior to Admission medications   Medication Sig Start Date End Date Taking? Authorizing Provider  hydrochlorothiazide (HYDRODIURIL) 25 MG tablet Take 1 tablet (25 mg total) by mouth daily. 02/03/18 04/04/18  Dionne BucySiadecki, Sebastian, MD  potassium chloride (MICRO-K) 10 MEQ CR capsule Take 1 capsule (10 mEq total) by mouth once. 02/16/16   Schaevitz, Myra Rudeavid Matthew, MD  sulfamethoxazole-trimethoprim (BACTRIM DS,SEPTRA DS) 800-160 MG tablet Take 1 tablet by mouth 2 (two) times daily for 10 days. 01/20/19 01/30/19  Ladiamond Gallina, Charlesetta IvoryJenise V Bacon, PA-C    Allergies Patient has no known allergies.  Family History  Problem Relation Age of Onset  . Hypertension Mother   . Diabetes Mother     Social History Social History   Tobacco Use  . Smoking status: Current Some Day Smoker  . Smokeless tobacco: Never Used  Substance Use Topics  . Alcohol use: No  . Drug use: Not on file    Review of Systems  Constitutional: Negative for fever. Eyes: Negative for visual changes. ENT: Negative for sore throat. Cardiovascular: Negative for chest pain. Respiratory: Negative for shortness of breath. Musculoskeletal: Negative for back pain. Skin: Negative for rash. Right breast abscess as above Neurological: Negative for  headaches, focal weakness or numbness. ____________________________________________  PHYSICAL EXAM:  VITAL SIGNS: ED Triage Vitals  Enc Vitals Group     BP 01/20/19 0930 (!) 144/94     Pulse Rate 01/20/19 0930 81     Resp 01/20/19 0930 14     Temp 01/20/19 0930 99 F (37.2 C)     Temp Source 01/20/19 0930 Oral     SpO2 01/20/19 0930 98 %     Weight 01/20/19 0921 230 lb (104.3 kg)     Height 01/20/19 0921 5\' 6"  (1.676 m)     Head Circumference --      Peak Flow --      Pain Score 01/20/19 0921 9     Pain Loc --      Pain Edu? --      Excl. in GC? --     Constitutional: Alert and oriented. Well appearing and in no distress. Head: Normocephalic and atraumatic. Eyes: Conjunctivae are normal. Normal extraocular movements Hematological/Lymphatic/Immunological: No cervical lymphadenopathy. Cardiovascular: Normal rate, regular rhythm. Normal distal pulses. Respiratory: Normal respiratory effort. No wheezes/rales/rhonchi. Gastrointestinal: Soft and nontender. No distention. Musculoskeletal: Nontender with normal range of motion in all extremities.  Neurologic:  Normal gait without ataxia. Normal speech and language. No gross focal neurologic deficits are appreciated. Skin:  Skin is warm, dry and intact. No rash noted.  Patient with a small area of induration and fluctuance to the central chest between the pedunculated breast.  There is no spontaneous drainage noted.  There is also a superior area that is pointing without spontaneous  drainage at this time.  Area is tender to palpation. ____________________________________________  PROCEDURES  Bactrim DS 1 PO Norco 5-325 mg PO  .Marland Kitchen.Incision and Drainage Date/Time: 01/20/2019 11:06 AM Performed by: Lissa HoardMenshew, Redina Zeller V Bacon, PA-C Authorized by: Lissa HoardMenshew, Kaizlee Carlino V Bacon, PA-C   Consent:    Consent obtained:  Verbal   Consent given by:  Patient   Risks discussed:  Bleeding, incomplete drainage, pain and damage to other organs    Alternatives discussed:  No treatment Universal protocol:    Procedure explained and questions answered to patient or proxy's satisfaction: yes     Relevant documents present and verified: yes     Test results available and properly labeled: yes     Imaging studies available: no     Required blood products, implants, devices, and special equipment available: no     Site/side marked: yes     Immediately prior to procedure a time out was called: no     Patient identity confirmed:  Verbally with patient Location:    Type:  Abscess   Location:  Trunk   Trunk location:  Chest Pre-procedure details:    Skin preparation:  Betadine Anesthesia (see MAR for exact dosages):    Anesthesia method:  Local infiltration   Local anesthetic:  Lidocaine 1% WITH epi Procedure type:    Complexity:  Simple Procedure details:    Incision types:  Single straight   Incision depth:  Subcutaneous   Scalpel blade:  11   Wound management:  Probed and deloculated and irrigated with saline   Drainage:  Purulent   Drainage amount:  Scant   Packing materials:  1/4 in gauze   Amount 1/4":  3 cm Post-procedure details:    Patient tolerance of procedure:  Tolerated well, no immediate complications  ____________________________________________  INITIAL IMPRESSION / ASSESSMENT AND PLAN / ED COURSE  Patient with ED evaluation of a central chest wall abscess. The wound drained a meaningful amount of purulence during the I&D procedure. She is discharged with wound care instructions and supplies. She should follow-up as discussed. ____________________________________________  FINAL CLINICAL IMPRESSION(S) / ED DIAGNOSES  Final diagnoses:  Abscess     Karmen StabsMenshew, Charlesetta IvoryJenise V Bacon, PA-C 01/20/19 1116    Minna AntisPaduchowski, Kevin, MD 01/20/19 1413

## 2019-01-20 NOTE — ED Notes (Signed)
Refer to triage note: upon assetssment this RN noticed abscesses located on mid chest, pus drainage noted; pt has a washcloth in place w/some drainage noted. Pt denies fevers. Pain 9/10 per pt pain is constant and burning. Family at bedside

## 2019-04-08 ENCOUNTER — Encounter: Payer: Self-pay | Admitting: Emergency Medicine

## 2019-04-08 ENCOUNTER — Other Ambulatory Visit: Payer: Self-pay

## 2019-04-08 ENCOUNTER — Emergency Department
Admission: EM | Admit: 2019-04-08 | Discharge: 2019-04-08 | Disposition: A | Discharge status: Home / Self Care | Payer: Medicaid Other | Attending: Emergency Medicine | Admitting: Emergency Medicine

## 2019-04-08 DIAGNOSIS — L0291 Cutaneous abscess, unspecified: Secondary | ICD-10-CM

## 2019-04-08 DIAGNOSIS — L02214 Cutaneous abscess of groin: Secondary | ICD-10-CM | POA: Insufficient documentation

## 2019-04-08 DIAGNOSIS — Z79899 Other long term (current) drug therapy: Secondary | ICD-10-CM | POA: Insufficient documentation

## 2019-04-08 DIAGNOSIS — I10 Essential (primary) hypertension: Secondary | ICD-10-CM | POA: Insufficient documentation

## 2019-04-08 DIAGNOSIS — F17200 Nicotine dependence, unspecified, uncomplicated: Secondary | ICD-10-CM | POA: Insufficient documentation

## 2019-04-08 MED ORDER — OXYCODONE-ACETAMINOPHEN 5-325 MG PO TABS
1.0000 | ORAL_TABLET | Freq: Once | ORAL | Status: AC
  Administered 2019-04-08: 1 via ORAL
  Filled 2019-04-08: qty 1

## 2019-04-08 MED ORDER — SULFAMETHOXAZOLE-TRIMETHOPRIM 800-160 MG PO TABS: 1.0000 | ORAL_TABLET | Freq: Two times a day (BID) | ORAL | 0 refills | Status: DC

## 2019-04-08 MED ORDER — SULFAMETHOXAZOLE-TRIMETHOPRIM 800-160 MG PO TABS
1.0000 | ORAL_TABLET | Freq: Once | ORAL | Status: AC
  Administered 2019-04-08: 12:00:00 1 via ORAL
  Filled 2019-04-08: qty 1

## 2019-04-08 MED ORDER — LIDOCAINE HCL (PF) 1 % IJ SOLN
5.0000 mL | Freq: Once | INTRAMUSCULAR | Status: AC
  Administered 2019-04-08: 11:00:00 5 mL via INTRADERMAL
  Filled 2019-04-08: qty 5

## 2019-04-08 MED ORDER — IBUPROFEN 600 MG PO TABS: 600.0000 mg | ORAL_TABLET | Freq: Four times a day (QID) | ORAL | 0 refills | Status: DC | PRN

## 2019-04-08 MED ORDER — CHLORHEXIDINE GLUCONATE 4 % EX LIQD: Freq: Every day | CUTANEOUS | 0 refills | Status: DC | PRN

## 2019-04-08 NOTE — ED Triage Notes (Signed)
Pt to ED via POV to have abscess drained. Pt is in NAD.

## 2019-04-08 NOTE — Discharge Instructions (Addendum)
You had a large groin abscess that was drained in the emergency department.  Please begin Bactrim to treat infection.  Return the emergency department in 2 days for packing removal.  You can use Hibiclens solution when showering around your menstrual cycle in an effort to prevent abscesses.

## 2019-04-08 NOTE — ED Notes (Signed)

## 2019-04-08 NOTE — ED Provider Notes (Signed)
[facility] Emergency Department Provider Note  ____________________________________________  Time seen: Approximately 10:56 AM  I have reviewed the triage vital signs and the nursing notes.   HISTORY  Chief Complaint Abscess    HPI Brittany Graves is a 33 y.o. female that presents to the emergency department for evaluation of right groin abscess for 2 days.  Patient states that she has a history of abscesses but are usually in her axilla.  These tend to occur around her menstrual cycle.  No fever, chills.   Past Medical History:  Diagnosis Date  . Hypertension     There are no active problems to display for this patient.   Past Surgical History:  Procedure Laterality Date  . TUBAL LIGATION      Prior to Admission medications   Medication Sig Start Date End Date Taking? Authorizing Provider  chlorhexidine (HIBICLENS) 4 % external liquid Apply topically daily as needed. 04/08/19   Enid DerryWagner, Gabriela Giannelli, PA-C  hydrochlorothiazide (HYDRODIURIL) 25 MG tablet Take 1 tablet (25 mg total) by mouth daily. 02/03/18 04/04/18  Dionne BucySiadecki, Sebastian, MD  ibuprofen (ADVIL) 600 MG tablet Take 1 tablet (600 mg total) by mouth every 6 (six) hours as needed. 04/08/19   Enid DerryWagner, Sasuke Yaffe, PA-C  potassium chloride (MICRO-K) 10 MEQ CR capsule Take 1 capsule (10 mEq total) by mouth once. 02/16/16   Schaevitz, Myra Rudeavid Matthew, MD  sulfamethoxazole-trimethoprim (BACTRIM DS) 800-160 MG tablet Take 1 tablet by mouth 2 (two) times daily. 04/08/19   Enid DerryWagner, Grenda Lora, PA-C    Allergies Patient has no known allergies.  Family History  Problem Relation Age of Onset  . Hypertension Mother   . Diabetes Mother     Social History Social History   Tobacco Use  . Smoking status: Current Some Day Smoker  . Smokeless tobacco: Never Used  Substance Use Topics  . Alcohol use: No  . Drug use: Not on file     Review of Systems  Constitutional: No fever/chills Cardiovascular: No chest  pain. Respiratory: No SOB. Gastrointestinal: No abdominal pain.  No nausea, no vomiting.  Musculoskeletal: Negative for musculoskeletal pain. Skin: Negative for abrasions, lacerations, ecchymosis. Positive for rash.  ____________________________________________   PHYSICAL EXAM:  VITAL SIGNS: ED Triage Vitals  Enc Vitals Group     BP 04/08/19 1027 (!) 142/92     Pulse Rate 04/08/19 1027 86     Resp 04/08/19 1027 16     Temp 04/08/19 1027 98.7 F (37.1 C)     Temp Source 04/08/19 1027 Oral     SpO2 04/08/19 1027 100 %     Weight --      Height --      Head Circumference --      Peak Flow --      Pain Score 04/08/19 1024 8     Pain Loc --      Pain Edu? --      Excl. in GC? --      Constitutional: Alert and oriented. Well appearing and in no acute distress. Eyes: Conjunctivae are normal. PERRL. EOMI. Head: Atraumatic. ENT:      Ears:      Nose: No congestion/rhinnorhea.      Mouth/Throat: Mucous membranes are moist.  Neck: No stridor. Cardiovascular: Normal rate, regular rhythm.  Good peripheral circulation. Respiratory: Normal respiratory effort without tachypnea or retractions. Lungs CTAB. Good air entry to the bases with no decreased or absent breath sounds. Gastrointestinal: Bowel sounds 4 quadrants. Soft and nontender to  palpation. No guarding or rigidity. No palpable masses. No distention. Musculoskeletal: Full range of motion to all extremities. No gross deformities appreciated. Neurologic:  Normal speech and language. No gross focal neurologic deficits are appreciated.  Skin:  Skin is warm, dry. 1 cm by 1 cm area of fluctuance and erythema to right groin with  Psychiatric: Mood and affect are normal. Speech and behavior are normal. Patient exhibits appropriate insight and judgement.   ____________________________________________   LABS (all labs ordered are listed, but only abnormal results are displayed)  Labs Reviewed - No data to  display ____________________________________________  EKG   ____________________________________________  RADIOLOGY  No results found.  ____________________________________________    PROCEDURES  Procedure(s) performed:    Procedures  INCISION AND DRAINAGE Performed by: Enid Derry Consent: Verbal consent obtained. Risks and benefits: risks, benefits and alternatives were discussed Type: abscess  Body area: groin  Anesthesia: local infiltration  Incision was made with a scalpel.  Local anesthetic: lidocaine 1 % without epinephrine  Anesthetic total: 4  ml  Complexity: complex Blunt dissection to break up loculations  Drainage: purulent  Drainage amount: moderate  Packing material: 1/4 in iodoform gauze  Patient tolerance: Patient tolerated the procedure well with no immediate complications.    Medications  oxyCODONE-acetaminophen (PERCOCET/ROXICET) 5-325 MG per tablet 1 tablet (1 tablet Oral Given 04/08/19 1109)  lidocaine (PF) (XYLOCAINE) 1 % injection 5 mL (5 mLs Intradermal Given by Other 04/08/19 1108)  sulfamethoxazole-trimethoprim (BACTRIM DS) 800-160 MG per tablet 1 tablet (1 tablet Oral Given 04/08/19 1218)     ____________________________________________   INITIAL IMPRESSION / ASSESSMENT AND PLAN / ED COURSE  Pertinent labs & imaging results that were available during my care of the patient were reviewed by me and considered in my medical decision making (see chart for details).  Review of the [place] CSRS was performed in accordance of the [facility] prior to dispensing any controlled drugs.     Patient's diagnosis is consistent with groin abscess.  Vital signs and exam are reassuring.  Abscess was drained in the emergency department without difficulty.  Packing was placed.  Patient will return in 2 days for packing removal.  Patient will be discharged home with prescriptions for Bactrim.  Patient is given ED precautions to return to the ED for any  worsening or new symptoms.     ____________________________________________  FINAL CLINICAL IMPRESSION(S) / ED DIAGNOSES  Final diagnoses:  Abscess      NEW MEDICATIONS STARTED DURING THIS VISIT:  ED Discharge Orders         Ordered    sulfamethoxazole-trimethoprim (BACTRIM DS) 800-160 MG tablet  2 times daily     04/08/19 1211    ibuprofen (ADVIL) 600 MG tablet  Every 6 hours PRN     04/08/19 1211    chlorhexidine (HIBICLENS) 4 % external liquid  Daily PRN     04/08/19 1211              This chart was dictated using voice recognition software/Dragon. Despite best efforts to proofread, errors can occur which can change the meaning. Any change was purely unintentional.    Enid Derry, PA-C 04/08/19 1601    Arnaldo Natal, MD 04/08/19 [id number]

## 2019-04-08 NOTE — ED Notes (Signed)
First Nurse Note: Pt to ED stating that she needs to have an abscess drained. Pt is in NAD.

## 2019-12-28 ENCOUNTER — Emergency Department
Admission: EM | Admit: 2019-12-28 | Discharge: 2019-12-28 | Disposition: A | Discharge status: Home / Self Care | Payer: Self-pay | Attending: Emergency Medicine | Admitting: Emergency Medicine

## 2019-12-28 ENCOUNTER — Encounter: Payer: Self-pay | Admitting: Emergency Medicine

## 2019-12-28 ENCOUNTER — Other Ambulatory Visit: Payer: Self-pay

## 2019-12-28 DIAGNOSIS — I10 Essential (primary) hypertension: Secondary | ICD-10-CM | POA: Insufficient documentation

## 2019-12-28 DIAGNOSIS — Z79899 Other long term (current) drug therapy: Secondary | ICD-10-CM | POA: Insufficient documentation

## 2019-12-28 DIAGNOSIS — L02214 Cutaneous abscess of groin: Secondary | ICD-10-CM | POA: Insufficient documentation

## 2019-12-28 DIAGNOSIS — L732 Hidradenitis suppurativa: Secondary | ICD-10-CM | POA: Insufficient documentation

## 2019-12-28 DIAGNOSIS — F17200 Nicotine dependence, unspecified, uncomplicated: Secondary | ICD-10-CM | POA: Insufficient documentation

## 2019-12-28 DIAGNOSIS — L0291 Cutaneous abscess, unspecified: Secondary | ICD-10-CM

## 2019-12-28 MED ORDER — HYDROCODONE-ACETAMINOPHEN 5-325 MG PO TABS: 1.0000 | ORAL_TABLET | Freq: Four times a day (QID) | ORAL | 0 refills | Status: DC | PRN

## 2019-12-28 MED ORDER — SULFAMETHOXAZOLE-TRIMETHOPRIM 800-160 MG PO TABS: 1.0000 | ORAL_TABLET | Freq: Two times a day (BID) | ORAL | 0 refills | Status: DC

## 2019-12-28 MED ORDER — CEPHALEXIN 500 MG PO CAPS: 500.0000 mg | ORAL_CAPSULE | Freq: Three times a day (TID) | ORAL | 0 refills | Status: DC

## 2019-12-28 NOTE — ED Provider Notes (Signed)
[facility] Emergency Department Provider Note  ____________________________________________   First MD Initiated Contact with Patient 12/28/19 1452     (approximate)  I have reviewed the triage vital signs and the nursing notes.   HISTORY  Chief Complaint Abscess    HPI Brittany Graves is a 34 y.o. female presents emergency department complaint abscess to the inguinal area.  History of same in the same area.  Patient also gets them under her arms.  No formal diagnosis of hidradenitis.  She denies any fever or chills.  States she feels like she just needs antibiotic as the area is already begun to drain    Past Medical History:  Diagnosis Date  . Hypertension     There are no problems to display for this patient.   Past Surgical History:  Procedure Laterality Date  . TUBAL LIGATION      Prior to Admission medications   Medication Sig Start Date End Date Taking? Authorizing Provider  cephALEXin (KEFLEX) 500 MG capsule Take 1 capsule (500 mg total) by mouth 3 (three) times daily. 12/28/19   Theon Sobotka, Roselyn Bering, PA-C  chlorhexidine (HIBICLENS) 4 % external liquid Apply topically daily as needed. 04/08/19   Enid Derry, PA-C  hydrochlorothiazide (HYDRODIURIL) 25 MG tablet Take 1 tablet (25 mg total) by mouth daily. 02/03/18 04/04/18  Dionne Bucy, MD  HYDROcodone-acetaminophen (NORCO/VICODIN) 5-325 MG tablet Take 1 tablet by mouth every 6 (six) hours as needed for moderate pain. 12/28/19   Kamali Sakata, Roselyn Bering, PA-C  potassium chloride (MICRO-K) 10 MEQ CR capsule Take 1 capsule (10 mEq total) by mouth once. 02/16/16   Schaevitz, Myra Rude, MD  sulfamethoxazole-trimethoprim (BACTRIM DS) 800-160 MG tablet Take 1 tablet by mouth 2 (two) times daily. 12/28/19   Faythe Ghee, PA-C    Allergies Patient has no known allergies.  Family History  Problem Relation Age of Onset  . Hypertension Mother   . Diabetes Mother     Social History Social History    Tobacco Use  . Smoking status: Current Some Day Smoker  . Smokeless tobacco: Never Used  Substance Use Topics  . Alcohol use: No  . Drug use: Not on file    Review of Systems  Constitutional: No fever/chills Eyes: No visual changes. ENT: No sore throat. Respiratory: Denies cough Genitourinary: Negative for dysuria. Musculoskeletal: Negative for back pain. Skin: Negative for rash.  Positive for abscess Psychiatric: no mood changes,     ____________________________________________   PHYSICAL EXAM:  VITAL SIGNS: ED Triage Vitals  Enc Vitals Group     BP 12/28/19 1334 (!) 143/90     Pulse Rate 12/28/19 1334 93     Resp 12/28/19 1334 16     Temp 12/28/19 1334 98.7 F (37.1 C)     Temp Source 12/28/19 1334 Oral     SpO2 12/28/19 1334 99 %     Weight 12/28/19 1335 220 lb (99.8 kg)     Height 12/28/19 1335 5\' 5"  (1.651 m)     Head Circumference --      Peak Flow --      Pain Score 12/28/19 1334 8     Pain Loc --      Pain Edu? --      Excl. in GC? --     Constitutional: Alert and oriented. Well appearing and in no acute distress. Eyes: Conjunctivae are normal.  Head: Atraumatic. Nose: No congestion/rhinnorhea. Mouth/Throat: Mucous membranes are moist.   Neck:  supple  no lymphadenopathy noted Cardiovascular: Normal rate, regular rhythm. Respiratory: Normal respiratory effort.  No retractions, GU: deferred, abscess noted in the inguinal area near the pubis, clear to yellow drainage which is watery noted. Musculoskeletal: FROM all extremities, warm and well perfused Neurologic:  Normal speech and language.  Skin:  Skin is warm, dry, positive abscess see above  psychiatric: Mood and affect are normal. Speech and behavior are normal.  ____________________________________________   LABS (all labs ordered are listed, but only abnormal results are displayed)  Labs Reviewed - No data to  display ____________________________________________   ____________________________________________  RADIOLOGY    ____________________________________________   PROCEDURES  Procedure(s) performed: No  Procedures    ____________________________________________   INITIAL IMPRESSION / ASSESSMENT AND PLAN / ED COURSE  Pertinent labs & imaging results that were available during my care of the patient were reviewed by me and considered in my medical decision making (see chart for details).   Patient is 34 year old female presents emergency department complaint of abscess.  See HPI  Physical exam patient appears well.  Abscess noted in the inguinal area which is typical of hidradenitis.  Area is already draining a watery to yellow liquid.  Explained the findings to the patient.  She is placed on Keflex and Septra.  Is given Vicodin for pain.  Work note for today and tomorrow if needed.  She is to follow-up with her regular doctor if not improving in 1 to 2 days.  Return emergency department worsening.  She states she understands will comply.  Patient is discharged stable condition.    Brittany Graves was evaluated in Emergency Department on 12/28/2019 for the symptoms described in the history of present illness. She was evaluated in the context of the global COVID-19 pandemic, which necessitated consideration that the patient might be at risk for infection with the SARS-CoV-2 virus that causes COVID-19. Institutional protocols and algorithms that pertain to the evaluation of patients at risk for COVID-19 are in a state of rapid change based on information released by regulatory bodies including the CDC and federal and state organizations. These policies and algorithms were followed during the patient's care in the ED.   As part of my medical decision making, I reviewed the following data within the [place] notes reviewed and incorporated, Old chart reviewed,  Notes from prior ED visits and [place] Controlled Substance Database  ____________________________________________   FINAL CLINICAL IMPRESSION(S) / ED DIAGNOSES  Final diagnoses:  Abscess  Hidradenitis      NEW MEDICATIONS STARTED DURING THIS VISIT:  New Prescriptions   CEPHALEXIN (KEFLEX) 500 MG CAPSULE    Take 1 capsule (500 mg total) by mouth 3 (three) times daily.   HYDROCODONE-ACETAMINOPHEN (NORCO/VICODIN) 5-325 MG TABLET    Take 1 tablet by mouth every 6 (six) hours as needed for moderate pain.   SULFAMETHOXAZOLE-TRIMETHOPRIM (BACTRIM DS) 800-160 MG TABLET    Take 1 tablet by mouth 2 (two) times daily.     Note:  This document was prepared using Dragon voice recognition software and may include unintentional dictation errors.    Versie Starks, PA-C 12/28/19 1513    Earleen Newport, MD 12/28/19 [phone]

## 2019-12-28 NOTE — ED Notes (Signed)
See triage note  Presents with possible abscess area to vaginal

## 2019-12-28 NOTE — ED Triage Notes (Signed)
Patient reports raised painful area to her vagina.  Patient states she has had similar issues in the past and area has had to be drained.  Patient states she noticed area 2 days ago.  Patient is having pain with walking and sitting.

## 2019-12-28 NOTE — Discharge Instructions (Signed)
Follow up with your regular doctor as needed.  Return if worsening, call one of the dermatology clinics listed on your discharge paperwork for evaluation as needed.  Take your medication as prescribed

## 2020-08-14 ENCOUNTER — Encounter (HOSPITAL_COMMUNITY): Payer: Self-pay | Admitting: Emergency Medicine

## 2020-08-14 ENCOUNTER — Other Ambulatory Visit: Payer: Self-pay

## 2020-08-14 ENCOUNTER — Emergency Department (HOSPITAL_COMMUNITY)
Admission: EM | Admit: 2020-08-14 | Discharge: 2020-08-14 | Disposition: A | Discharge status: Home / Self Care | Payer: Self-pay | Attending: Emergency Medicine | Admitting: Emergency Medicine

## 2020-08-14 DIAGNOSIS — I10 Essential (primary) hypertension: Secondary | ICD-10-CM | POA: Insufficient documentation

## 2020-08-14 DIAGNOSIS — L02411 Cutaneous abscess of right axilla: Secondary | ICD-10-CM

## 2020-08-14 DIAGNOSIS — Z79899 Other long term (current) drug therapy: Secondary | ICD-10-CM | POA: Insufficient documentation

## 2020-08-14 DIAGNOSIS — L0201 Cutaneous abscess of face: Secondary | ICD-10-CM

## 2020-08-14 DIAGNOSIS — F17200 Nicotine dependence, unspecified, uncomplicated: Secondary | ICD-10-CM | POA: Insufficient documentation

## 2020-08-14 MED ORDER — DOXYCYCLINE HYCLATE 100 MG PO CAPS: 100.0000 mg | ORAL_CAPSULE | Freq: Two times a day (BID) | ORAL | 0 refills | Status: DC

## 2020-08-14 MED ORDER — HYDROCODONE-ACETAMINOPHEN 5-325 MG PO TABS
1.0000 | ORAL_TABLET | Freq: Once | ORAL | Status: AC
  Administered 2020-08-14: 1 via ORAL
  Filled 2020-08-14: qty 1

## 2020-08-14 MED ORDER — HYDROCHLOROTHIAZIDE 25 MG PO TABS: 25.0000 mg | ORAL_TABLET | Freq: Every day | ORAL | 0 refills | Status: DC

## 2020-08-14 NOTE — ED Triage Notes (Signed)
Pt endorses swelling under chin and under right arm 2 days ago. Spots are painful. No trouble swallowing. Hx of HTN but not taking meds.

## 2020-08-14 NOTE — ED Notes (Signed)
Pt has reocurring abscess under there right arm X4 days.  Has a new abscess on the left side of her face, jaw line.

## 2020-08-14 NOTE — ED Notes (Signed)
Pt named called 3 times for vs no response

## 2020-08-14 NOTE — ED Provider Notes (Signed)
[facility] EMERGENCY DEPARTMENT Provider Note   CSN: [id number] Arrival date & time: 08/14/20  1220     History Chief Complaint  Patient presents with  . Abscess    Brittany Graves is a 34 y.o. female.  34 year old female with history of hypertension and abscesses who presents with abscesses.  For the past several days, she has had worsening swelling and pain in her right axilla and under her chin.  She is concerned about abscess as she has history of multiple abscesses similar to this.  She denies any fevers, breathing problems, or swallowing problems.  No medications prior to arrival.  Patient also notes that she has been off of hypertension medications for a while as she has not yet established care with PCP.  The history is provided by the patient.  Abscess      Past Medical History:  Diagnosis Date  . Hypertension     There are no problems to display for this patient.   Past Surgical History:  Procedure Laterality Date  . TUBAL LIGATION       OB History   No obstetric history on file.     Family History  Problem Relation Age of Onset  . Hypertension Mother   . Diabetes Mother     Social History   Tobacco Use  . Smoking status: Current Some Day Smoker  . Smokeless tobacco: Never Used  Substance Use Topics  . Alcohol use: No  . Drug use: Not on file    Home Medications Prior to Admission medications   Medication Sig Start Date End Date Taking? Authorizing Provider  cephALEXin (KEFLEX) 500 MG capsule Take 1 capsule (500 mg total) by mouth 3 (three) times daily. 12/28/19   Fisher, Roselyn Bering, PA-C  chlorhexidine (HIBICLENS) 4 % external liquid Apply topically daily as needed. 04/08/19   Enid Derry, PA-C  doxycycline (VIBRAMYCIN) 100 MG capsule Take 1 capsule (100 mg total) by mouth 2 (two) times daily. 08/14/20   Charna Neeb, Ambrose Finland, MD  hydrochlorothiazide (HYDRODIURIL) 25 MG tablet Take 1 tablet (25 mg total) by mouth daily. 08/14/20  10/13/20  Bryleigh Ottaway, Ambrose Finland, MD  HYDROcodone-acetaminophen (NORCO/VICODIN) 5-325 MG tablet Take 1 tablet by mouth every 6 (six) hours as needed for moderate pain. 12/28/19   Fisher, Roselyn Bering, PA-C  potassium chloride (MICRO-K) 10 MEQ CR capsule Take 1 capsule (10 mEq total) by mouth once. 02/16/16   Schaevitz, Myra Rude, MD  sulfamethoxazole-trimethoprim (BACTRIM DS) 800-160 MG tablet Take 1 tablet by mouth 2 (two) times daily. 12/28/19   Faythe Ghee, PA-C    Allergies    Patient has no known allergies.  Review of Systems   Review of Systems All other systems reviewed and are negative except that which was mentioned in HPI  Physical Exam Updated Vital Signs BP (!) 156/103 (BP Location: Left Arm)   Pulse 88   Temp 98.6 F (37 C) (Oral)   Resp 18   Ht 5\' 4"  (1.626 m)   Wt 104.3 kg   SpO2 100%   BMI 39.48 kg/m   Physical Exam Vitals and nursing note reviewed.  Constitutional:      General: She is not in acute distress.    Appearance: She is well-developed.  HENT:     Head: Normocephalic and atraumatic.     Comments: 2cm area of swelling and induration w/ tenderness just under L chin with no drainage or surrounding erythema    Mouth/Throat:  Mouth: Mucous membranes are moist.  Eyes:     Conjunctiva/sclera: Conjunctivae normal.  Musculoskeletal:     Cervical back: Neck supple.  Lymphadenopathy:     Cervical: Cervical adenopathy present.  Skin:    General: Skin is warm and dry.     Comments: 3cm area of induration and swelling R axilla w/ erythema extending onto proximal arm  Neurological:     Mental Status: She is alert and oriented to person, place, and time.  Psychiatric:        Judgment: Judgment normal.     ED Results / Procedures / Treatments   Labs (all labs ordered are listed, but only abnormal results are displayed) Labs Reviewed - No data to display  EKG None  Radiology No results found.  Procedures .Marland KitchenIncision and Drainage  Date/Time:  08/14/2020 9:30 PM Performed by: Laurence Spates, MD Authorized by: Laurence Spates, MD   Consent:    Consent obtained:  Verbal   Consent given by:  Patient   Risks discussed:  Bleeding, incomplete drainage and pain (scar)   Alternatives discussed:  No treatment Location:    Type:  Abscess   Size:  2cm   Location:  Head   Head/neck location: under chin. Pre-procedure details:    Skin preparation:  Betadine Anesthesia (see MAR for exact dosages):    Anesthesia method:  Local infiltration   Local anesthetic:  Lidocaine 1% WITH epi Procedure type:    Complexity:  Simple Procedure details:    Incision types:  Stab incision   Incision depth:  Dermal   Scalpel blade:  11   Wound management:  Probed and deloculated   Drainage:  Purulent   Drainage amount:  Copious   Wound treatment:  Wound left open   Packing materials:  None Post-procedure details:    Patient tolerance of procedure:  Tolerated well, no immediate complications .Marland KitchenIncision and Drainage  Date/Time: 08/14/2020 9:32 PM Performed by: Laurence Spates, MD Authorized by: Laurence Spates, MD   Consent:    Consent obtained:  Verbal   Consent given by:  Patient   Risks discussed:  Bleeding, incomplete drainage and pain (scar)   Alternatives discussed:  No treatment Location:    Type:  Abscess   Location:  Upper extremity   Upper extremity location: axilla. Pre-procedure details:    Skin preparation:  Betadine Anesthesia (see MAR for exact dosages):    Anesthesia method:  Local infiltration   Local anesthetic:  Lidocaine 1% WITH epi Procedure type:    Complexity:  Simple Procedure details:    Incision types:  Single straight   Incision depth:  Dermal   Scalpel blade:  11   Wound management:  Probed and deloculated   Drainage:  Purulent   Drainage amount:  Copious   Wound treatment:  Wound left open   Packing materials:  1/4 in iodoform gauze   Amount 1/4" iodoform:  2 cm Post-procedure  details:    Patient tolerance of procedure:  Tolerated well, no immediate complications   (including critical care time)  Medications Ordered in ED Medications  HYDROcodone-acetaminophen (NORCO/VICODIN) 5-325 MG per tablet 1 tablet (has no administration in time range)    ED Course  I have reviewed the triage vital signs and the nursing notes.    MDM Rules/Calculators/A&P                          2 abscesses on exam, each was  incised and drained with copious pus.  Will start on antibiotics given mild cellulitic changes on her arm.  She has no surrounding edema on her chin or floor of mouth swelling to suggest Ludwick's or other deep space infection.  Discussed wound care and reviewed return precautions.  Will provide hypertension medication until patient can get into PCP clinic. Final Clinical Impression(s) / ED Diagnoses Final diagnoses:  Abscess of axilla, right  Abscess of chin    Rx / DC Orders ED Discharge Orders         Ordered    doxycycline (VIBRAMYCIN) 100 MG capsule  2 times daily        08/14/20 2127    hydrochlorothiazide (HYDRODIURIL) 25 MG tablet  Daily        08/14/20 2127           [place] Ganoe, Ambrose Finland, MD 08/14/20 2137

## 2021-02-21 ENCOUNTER — Emergency Department (HOSPITAL_COMMUNITY)
Admission: EM | Admit: 2021-02-21 | Discharge: 2021-02-21 | Disposition: A | Discharge status: Home / Self Care | Payer: Self-pay | Attending: Emergency Medicine | Admitting: Emergency Medicine

## 2021-02-21 ENCOUNTER — Encounter (HOSPITAL_COMMUNITY): Payer: Self-pay

## 2021-02-21 DIAGNOSIS — Z5321 Procedure and treatment not carried out due to patient leaving prior to being seen by health care provider: Secondary | ICD-10-CM | POA: Insufficient documentation

## 2021-02-21 DIAGNOSIS — E162 Hypoglycemia, unspecified: Secondary | ICD-10-CM | POA: Insufficient documentation

## 2021-02-21 LAB — COMPREHENSIVE METABOLIC PANEL
ALT: 19 U/L (ref 0–44)
AST: 19 U/L (ref 15–41)
Albumin: 3.9 g/dL (ref 3.5–5.0)
Alkaline Phosphatase: 82 U/L (ref 38–126)
Anion gap: 8 (ref 5–15)
BUN: 5 mg/dL — ABNORMAL LOW (ref 6–20)
CO2: 25 mmol/L (ref 22–32)
Calcium: 8.9 mg/dL (ref 8.9–10.3)
Chloride: 101 mmol/L (ref 98–111)
Creatinine, Ser: 0.67 mg/dL (ref 0.44–1.00)
GFR, Estimated: >60 mL/min (ref >60)
Glucose, Bld: 133 mg/dL — ABNORMAL HIGH (ref 70–99)
Potassium: 3.1 mmol/L — ABNORMAL LOW (ref 3.5–5.1)
Sodium: 134 mmol/L — ABNORMAL LOW (ref 135–145)
Total Bilirubin: 0.5 mg/dL (ref 0.3–1.2)
Total Protein: 7.2 g/dL (ref 6.5–8.1)

## 2021-02-21 LAB — CBC
HCT: 35.4 % — ABNORMAL LOW (ref 36.0–46.0)
Hemoglobin: 11 g/dL — ABNORMAL LOW (ref 12.0–15.0)
MCH: 24.8 pg — ABNORMAL LOW (ref 26.0–34.0)
MCHC: 31.1 g/dL (ref 30.0–36.0)
MCV: 79.9 fL — ABNORMAL LOW (ref 80.0–100.0)
Platelets: 338 10*3/uL (ref 150–400)
RBC: 4.43 MIL/uL (ref 3.87–5.11)
RDW: 17.2 % — ABNORMAL HIGH (ref 11.5–15.5)
WBC: 6.7 10*3/uL (ref 4.0–10.5)
nRBC: 0 % (ref 0.0–0.2)

## 2021-02-21 LAB — CBG MONITORING, ED: Glucose-Capillary: 136 mg/dL — ABNORMAL HIGH (ref 70–99)

## 2021-02-21 NOTE — ED Notes (Signed)
Pt left , checked out with me

## 2021-02-21 NOTE — ED Triage Notes (Signed)
Pt is here today due to hypoglycemia. Pt was at work when she got really weak and diaphoretic. Pt was given orange and crackers by coworkers. EMS arrived and pt blood sugar was 100.Pt states she think her blood dropped during that episode.

## 2021-04-25 ENCOUNTER — Encounter (HOSPITAL_COMMUNITY): Payer: Self-pay | Admitting: Pharmacy Technician

## 2021-04-25 ENCOUNTER — Other Ambulatory Visit: Payer: Self-pay

## 2021-04-25 ENCOUNTER — Emergency Department (HOSPITAL_COMMUNITY)
Admission: EM | Admit: 2021-04-25 | Discharge: 2021-04-25 | Disposition: A | Discharge status: Home / Self Care | Payer: Self-pay | Attending: Emergency Medicine | Admitting: Emergency Medicine

## 2021-04-25 DIAGNOSIS — F17200 Nicotine dependence, unspecified, uncomplicated: Secondary | ICD-10-CM | POA: Insufficient documentation

## 2021-04-25 DIAGNOSIS — I10 Essential (primary) hypertension: Secondary | ICD-10-CM | POA: Insufficient documentation

## 2021-04-25 DIAGNOSIS — L02411 Cutaneous abscess of right axilla: Secondary | ICD-10-CM | POA: Insufficient documentation

## 2021-04-25 MED ORDER — DOXYCYCLINE HYCLATE 100 MG PO CAPS: 100.0000 mg | ORAL_CAPSULE | Freq: Two times a day (BID) | ORAL | 0 refills | Status: AC

## 2021-04-25 MED ORDER — HIBICLENS 4 % EX LIQD: Freq: Every day | CUTANEOUS | 0 refills | Status: DC | PRN

## 2021-04-25 MED ORDER — OXYCODONE-ACETAMINOPHEN 5-325 MG PO TABS
1.0000 | ORAL_TABLET | Freq: Once | ORAL | Status: AC
Start: 2021-04-25 — End: 2021-04-25
  Administered 2021-04-25: 1 via ORAL
  Filled 2021-04-25: qty 1

## 2021-04-25 MED ORDER — LIDOCAINE-EPINEPHRINE (PF) 2 %-1:200000 IJ SOLN
10.0000 mL | Freq: Once | INTRAMUSCULAR | Status: AC
  Administered 2021-04-25: 10 mL via INTRADERMAL
  Filled 2021-04-25: qty 20

## 2021-04-25 NOTE — Discharge Instructions (Addendum)
IT was great to see you.   It will likely continue to drain and heal on it own.  Start using the Hibiclens cleanser as it has helped you previously.  I have sent an antibiotic called doxycycline you will take this for 7 days.  You can continue to do warm compresses.  Alternate Tylenol and ibuprofen for pain control.  Recommend following up with a primary care provider for your high blood pressure and hidradenitis management.  We have also included contact for general surgery to further evaluate your hidradenitis as well.  Return if the area is becoming significantly larger with skin redness, fever, fatigue, or any other concern.

## 2021-04-25 NOTE — ED Triage Notes (Addendum)
Pt here for evaluation of abscess to R axilla. Pt states hx of same. Denies fevers/chills.

## 2021-04-25 NOTE — ED Provider Notes (Signed)
[facility] EMERGENCY DEPARTMENT Provider Note   CSN: [id number] Arrival date & time: 04/25/21  1141     History Chief Complaint  Patient presents with  . Abscess    Brittany Graves is a 35 y.o. female with a history of hypertension and hidradenitis suppurativa, presenting for evaluation of a right axilla abscess.  She has a known history of recurrent abscesses requiring I&D.  Reports this started approximately 2 days ago and has increased in size since onset.  Exquisitely tender.  Notes that she has chronic induration in this area.  She has been using warm compresses without any drainage thus far.  Denies any chills, fever, skin erythema, or fatigue.  She does not have a PCP or dermatologist, not currently taking anything for her hidradenitis suppurativa.  Previously used Hibiclens solution and did help keep it under better control.  She was brought in by her fianc today.    Past Medical History:  Diagnosis Date  . Hypertension     There are no problems to display for this patient.   Past Surgical History:  Procedure Laterality Date  . TUBAL LIGATION       OB History   No obstetric history on file.     Family History  Problem Relation Age of Onset  . Hypertension Mother   . Diabetes Mother     Social History   Tobacco Use  . Smoking status: Current Some Day Smoker  . Smokeless tobacco: Never Used  Substance Use Topics  . Alcohol use: No    Home Medications Prior to Admission medications   Medication Sig Start Date End Date Taking? Authorizing Provider  doxycycline (VIBRAMYCIN) 100 MG capsule Take 1 capsule (100 mg total) by mouth 2 (two) times daily for 7 days. 04/25/21 05/02/21 Yes Christana Angelica, Janace Litten, DO  chlorhexidine (HIBICLENS) 4 % external liquid Apply topically daily as needed. 04/25/21   Allayne Stack, DO  hydrochlorothiazide (HYDRODIURIL) 25 MG tablet Take 1 tablet (25 mg total) by mouth daily. 08/14/20 10/13/20  Little, Ambrose Finland, MD  HYDROcodone-acetaminophen (NORCO/VICODIN) 5-325 MG tablet Take 1 tablet by mouth every 6 (six) hours as needed for moderate pain. 12/28/19   Fisher, Roselyn Bering, PA-C  potassium chloride (MICRO-K) 10 MEQ CR capsule Take 1 capsule (10 mEq total) by mouth once. 02/16/16   Schaevitz, Myra Rude, MD    Allergies    Patient has no known allergies.  Review of Systems   Review of Systems  Constitutional: Negative for chills, fatigue and fever.  Respiratory: Negative for shortness of breath.   Cardiovascular: Negative for chest pain.  Gastrointestinal: Negative for abdominal pain.  Genitourinary: Negative for dysuria.  Skin: Positive for wound. Negative for rash.  Neurological: Negative for dizziness and light-headedness.    Physical Exam Updated Vital Signs BP (!) 149/92   Pulse 92   Temp 98.9 F (37.2 C) (Oral)   Resp 17   SpO2 99%   Physical Exam Constitutional:      General: She is not in acute distress.    Appearance: Normal appearance. She is not ill-appearing or toxic-appearing.     Comments: Well-appearing, sitting comfortably, however uncomfortable when has to move her right arm.  HENT:     Head: Normocephalic and atraumatic.     Mouth/Throat:     Mouth: Mucous membranes are moist.  Cardiovascular:     Pulses: Normal pulses.  Pulmonary:     Effort: Pulmonary effort is normal.  Abdominal:  Palpations: Abdomen is soft.  Skin:    General: Skin is warm and dry.     Capillary Refill: Capillary refill takes less than 2 seconds.     Comments: Approximate 4 cm X 2 cm region of induration present in right axilla, however the proximately 1 cm area in the central portion with fluctuance.  Exquisitely tender to palpation.  No overlying skin erythema.  Neurological:     General: No focal deficit present.     Mental Status: She is alert and oriented to person, place, and time.       ED Results / Procedures / Treatments   Labs (all labs ordered are listed, but  only abnormal results are displayed) Labs Reviewed - No data to display  EKG None  Radiology No results found.  Procedures .Marland KitchenIncision and Drainage  Date/Time: 04/25/2021 4:32 PM Performed by: Allayne Stack, DO Authorized by: Milagros Loll, MD   Consent:    Consent obtained:  Verbal   Consent given by:  Patient   Risks, benefits, and alternatives were discussed: yes     Risks discussed:  Bleeding, incomplete drainage, pain and infection   Alternatives discussed:  No treatment and alternative treatment Universal protocol:    Patient identity confirmed:  Verbally with patient Location:    Type:  Abscess   Size:  1cm   Location:  Upper extremity   Upper extremity location: axilla. Pre-procedure details:    Skin preparation:  Chlorhexidine with alcohol Sedation:    Sedation type:  None Anesthesia:    Anesthesia method:  Local infiltration   Local anesthetic:  Lidocaine 2% WITH epi Procedure type:    Complexity:  Simple Procedure details:    Incision types:  Stab incision   Drainage:  Purulent   Drainage amount:  Scant   Wound treatment:  Wound left open Post-procedure details:    Procedure completion:  Tolerated with difficulty    Medications Ordered in ED Medications  oxyCODONE-acetaminophen (PERCOCET/ROXICET) 5-325 MG per tablet 1 tablet (1 tablet Oral Given 04/25/21 1505)  lidocaine-EPINEPHrine (XYLOCAINE W/EPI) 2 %-1:200000 (PF) injection 10 mL (10 mLs Intradermal Given by Other 04/25/21 1531)    ED Course  I have reviewed the triage vital signs and the nursing notes.  Pertinent labs & imaging results that were available during my care of the patient were reviewed by me and considered in my medical decision making (see chart for details).    MDM Rules/Calculators/A&P                          35 year old female, with a history of hidradenitis suppurativa and recurrent abscesses, presenting for evaluation of a right axilla abscess.  Chronic induration  present in right axilla, however small area of fluctuance present and underwent I&D of this region with purulent drainage retrieved.  No packing placed due to small size.  Fortunately afebrile and hemodynamically stable, well-appearing without concern for systemic infection at this time.  Rx'd doxycycline x 7 days and Hibiclens for HS control.  Encourage Tylenol/ibuprofen/warm compresses.  Recommended establishing care with PCP for hidradenitis and hypertension control, she plans on doing this ASAP.  Provided contact information for general surgery as well given recurrent flares with scarring.  Final Clinical Impression(s) / ED Diagnoses Final diagnoses:  Abscess of axilla, right    Rx / DC Orders ED Discharge Orders         Ordered    doxycycline (VIBRAMYCIN) 100 MG capsule  2 times daily        04/25/21 1536    chlorhexidine (HIBICLENS) 4 % external liquid  Daily PRN        04/25/21 1536           Allayne Stack, DO 04/25/21 1640    Milagros Loll, MD 05/01/21 1630

## 2021-04-25 NOTE — ED Provider Notes (Signed)
Emergency Medicine Provider Triage Evaluation Note  RONNI OSTERBERG , a 35 y.o. female  was evaluated in triage.  Pt complains of abscess in right armpit x 2 days. No fevers or chills. History of frequent abscesses   Review of Systems  Positive: As above Negative: As above   Physical Exam  BP (!) 152/108   Pulse (!) 107   Temp 98.8 F (37.1 C) (Oral)   Resp 16   SpO2 99%  Gen:   Awake, no distress  Resp:  Normal effort  MSK:   Moves extremities without difficulty  Other:  Abscess in right axilla   Medical Decision Making  Medically screening exam initiated at 12:21 PM.  Appropriate orders placed.  Marylouise L Ceja was informed that the remainder of the evaluation will be completed by another provider, this initial triage assessment does not replace that evaluation, and the importance of remaining in the ED until their evaluation is complete.     Mare Ferrari, PA-C 04/25/21 1223    Melene Plan, DO 04/25/21 1401

## 2021-08-11 ENCOUNTER — Emergency Department (HOSPITAL_COMMUNITY)
Admission: EM | Admit: 2021-08-11 | Discharge: 2021-08-11 | Disposition: A | Discharge status: Home / Self Care | Payer: Self-pay | Attending: Emergency Medicine | Admitting: Emergency Medicine

## 2021-08-11 ENCOUNTER — Other Ambulatory Visit: Payer: Self-pay

## 2021-08-11 ENCOUNTER — Emergency Department (HOSPITAL_COMMUNITY): Payer: Self-pay

## 2021-08-11 ENCOUNTER — Encounter (HOSPITAL_COMMUNITY): Payer: Self-pay | Admitting: Emergency Medicine

## 2021-08-11 DIAGNOSIS — N9489 Other specified conditions associated with female genital organs and menstrual cycle: Secondary | ICD-10-CM | POA: Insufficient documentation

## 2021-08-11 DIAGNOSIS — R42 Dizziness and giddiness: Secondary | ICD-10-CM | POA: Insufficient documentation

## 2021-08-11 DIAGNOSIS — Z79899 Other long term (current) drug therapy: Secondary | ICD-10-CM | POA: Insufficient documentation

## 2021-08-11 DIAGNOSIS — R55 Syncope and collapse: Secondary | ICD-10-CM | POA: Insufficient documentation

## 2021-08-11 DIAGNOSIS — I10 Essential (primary) hypertension: Secondary | ICD-10-CM | POA: Insufficient documentation

## 2021-08-11 LAB — CBC
HCT: 34.4 % — ABNORMAL LOW (ref 36.0–46.0)
Hemoglobin: 10.6 g/dL — ABNORMAL LOW (ref 12.0–15.0)
MCH: 24.8 pg — ABNORMAL LOW (ref 26.0–34.0)
MCHC: 30.8 g/dL (ref 30.0–36.0)
MCV: 80.4 fL (ref 80.0–100.0)
Platelets: 329 10*3/uL (ref 150–400)
RBC: 4.28 MIL/uL (ref 3.87–5.11)
RDW: 16.4 % — ABNORMAL HIGH (ref 11.5–15.5)
WBC: 5.4 10*3/uL (ref 4.0–10.5)
nRBC: 0 % (ref 0.0–0.2)

## 2021-08-11 LAB — BASIC METABOLIC PANEL
Anion gap: 4 — ABNORMAL LOW (ref 5–15)
BUN: 5 mg/dL — ABNORMAL LOW (ref 6–20)
CO2: 25 mmol/L (ref 22–32)
Calcium: 8.8 mg/dL — ABNORMAL LOW (ref 8.9–10.3)
Chloride: 108 mmol/L (ref 98–111)
Creatinine, Ser: 0.58 mg/dL (ref 0.44–1.00)
GFR, Estimated: >60 mL/min (ref >60)
Glucose, Bld: 99 mg/dL (ref 70–99)
Potassium: 3.3 mmol/L — ABNORMAL LOW (ref 3.5–5.1)
Sodium: 137 mmol/L (ref 135–145)

## 2021-08-11 LAB — I-STAT BETA HCG BLOOD, ED (MC, WL, AP ONLY): I-stat hCG, quantitative: <5 m[IU]/mL (ref <5)

## 2021-08-11 LAB — CBG MONITORING, ED: Glucose-Capillary: 101 mg/dL — ABNORMAL HIGH (ref 70–99)

## 2021-08-11 NOTE — ED Triage Notes (Signed)
Pt here via [facility] from work for dizziness and near syncope. Pt reports decreased intake recently, denies N/V/D, reports RLQ abd pain. Hx HTN, compliant w/ meds. Pt reports similar episode a couple of months ago.

## 2021-08-11 NOTE — ED Provider Notes (Signed)
[facility] EMERGENCY DEPARTMENT Provider Note   CSN: [id number] Arrival date & time: 08/11/21  1303     History Chief Complaint  Patient presents with   Dizziness    Brittany Graves is a 35 y.o. female presenting with near-syncopal episode.  Patient was at work this afternoon when she suddenly "felt like she was going to pass out". She became dizzy, hot, felt like her breathing was fast and her head was throbbing. She had been standing at the time. She then sat down on a bench and her manager called EMS. No fall or LOC. Reports fatigue and somewhat decreased PO intake over past few days. Notes her menstrual cycle started 3 days ago. Her menses are always heavy- she needs to change pads every 1 hour. Currently endorses right frontal headache. Otherwise no symptoms currently. She has ambulated since the incident without difficulty. History of one similar episode ~6 months ago. Presented to ED but left without being seen. Denies palpitations, chest pain, weakness, vomiting, or fever/chills.    Past Medical History:  Diagnosis Date   Hypertension     There are no problems to display for this patient.   Past Surgical History:  Procedure Laterality Date   TUBAL LIGATION       OB History   No obstetric history on file.     Family History  Problem Relation Age of Onset   Hypertension Mother    Diabetes Mother     Social History   Tobacco Use   Smoking status: Some Days   Smokeless tobacco: Never  Substance Use Topics   Alcohol use: No    Home Medications Prior to Admission medications   Medication Sig Start Date End Date Taking? Authorizing Provider  chlorhexidine (HIBICLENS) 4 % external liquid Apply topically daily as needed. 04/25/21   Allayne Stack, DO  hydrochlorothiazide (HYDRODIURIL) 25 MG tablet Take 1 tablet (25 mg total) by mouth daily. 08/14/20 10/13/20  Little, Ambrose Finland, MD  HYDROcodone-acetaminophen (NORCO/VICODIN) 5-325 MG  tablet Take 1 tablet by mouth every 6 (six) hours as needed for moderate pain. 12/28/19   Fisher, Roselyn Bering, PA-C  potassium chloride (MICRO-K) 10 MEQ CR capsule Take 1 capsule (10 mEq total) by mouth once. 02/16/16   Schaevitz, Myra Rude, MD    Allergies    Patient has no known allergies.  Review of Systems   Review of Systems  Constitutional:  Negative for chills and fever.  Respiratory:  Negative for cough and shortness of breath.   Cardiovascular:  Negative for chest pain and palpitations.  Gastrointestinal:  Negative for abdominal pain, nausea and vomiting.  Neurological:  Positive for dizziness and headaches. Negative for syncope.   Physical Exam Updated Vital Signs BP (!) 156/110   Pulse 76   Temp 98.8 F (37.1 C) (Oral)   Resp 16   Ht 5\' 4"  (1.626 m)   Wt 108.9 kg   LMP 08/08/2021 (Approximate)   SpO2 100%   BMI 41.20 kg/m   Physical Exam Constitutional:      General: She is not in acute distress.    Appearance: Normal appearance.  HENT:     Head: Normocephalic and atraumatic.  Eyes:     Extraocular Movements: Extraocular movements intact.     Conjunctiva/sclera: Conjunctivae normal.     Pupils: Pupils are equal, round, and reactive to light.  Cardiovascular:     Rate and Rhythm: Normal rate and regular rhythm.     Heart sounds:  Normal heart sounds.  Pulmonary:     Effort: Pulmonary effort is normal.     Breath sounds: Normal breath sounds.  Abdominal:     Palpations: Abdomen is soft.     Tenderness: There is no abdominal tenderness.  Skin:    General: Skin is warm.  Neurological:     General: No focal deficit present.     Mental Status: She is alert. Mental status is at baseline.     Cranial Nerves: No cranial nerve deficit.     Motor: No weakness.    ED Results / Procedures / Treatments   Labs (all labs ordered are listed, but only abnormal results are displayed) Labs Reviewed  BASIC METABOLIC PANEL - Abnormal; Notable for the following  components:      Result Value   Potassium 3.3 (*)    BUN 5 (*)    Calcium 8.8 (*)    Anion gap 4 (*)    All other components within normal limits  CBC - Abnormal; Notable for the following components:   Hemoglobin 10.6 (*)    HCT 34.4 (*)    MCH 24.8 (*)    RDW 16.4 (*)    All other components within normal limits  CBG MONITORING, ED - Abnormal; Notable for the following components:   Glucose-Capillary 101 (*)    All other components within normal limits  URINALYSIS, ROUTINE W REFLEX MICROSCOPIC  CBG MONITORING, ED  I-STAT BETA HCG BLOOD, ED ([facility], [facility], [facility] ONLY)    EKG None  Radiology CT HEAD WO CONTRAST ( )  Result Date: 08/11/2021 CLINICAL DATA:  Dizziness, nonspecific. Additional provided: Lightheadedness and dizziness, history of hypertension. EXAM: CT HEAD WITHOUT CONTRAST TECHNIQUE: Contiguous axial images were obtained from the base of the skull through the vertex without intravenous contrast. COMPARISON:  Head CT 02/03/2018. FINDINGS: Brain: Cerebral volume is normal. There is no acute intracranial hemorrhage. No demarcated cortical infarct. No extra-axial fluid collection. No evidence of an intracranial mass. No midline shift. Partially empty sella turcica. Prominent dural calcifications. Vascular: No hyperdense vessel Skull: No calvarial fracture. 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 Sinuses/Orbits: Visualized orbits show no acute finding. Mild bilateral ethmoid and sphenoid sinus mucosal thickening at the imaged levels. IMPRESSION: No evidence of acute intracranial abnormality. Redemonstrated partially empty sella turcica. This finding is very commonly incidental, but can be associated with idiopathic intracranial hypertension. 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 Mild bilateral ethmoid  and sphenoid sinus mucosal thickening at the imaged levels. Electronically Signed   By: Jackey Loge D.O.   On: 08/11/2021 14:53    Procedures Procedures   Medications Ordered in ED Medications - No data to display  ED Course  I have reviewed the triage vital signs and the nursing notes.  Pertinent labs & imaging results that were available during my care of the patient were reviewed by me and considered in my medical decision making (see chart for details).    MDM Rules/Calculators/A&P                         35 year old female with hx of HTN presents after near-syncopal episode at work today.  No true syncope or fall. Currently endorses right frontal headache and fatigue but no other symptoms. History most consistent with vasovagal episode.  Doubt orthostatic hypotension as it  was not with position change and her BP is elevated to 164/115 here. Do not suspect cardiac syncope given the prodrome and normal EKG. Neuro exam is also normal. No seizure activity was noted.  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 Neither of these are likely to have contributed to her current presentation. Basic labs including CBC/CMP are largely unremarkable other than mild anemia and hypokalemia, which are consistent with her heavy menses and decreased PO intake.  Patient stable for discharge home. Advised PCP follow up to address her HTN and heavy menses.  Final Clinical Impression(s) / ED Diagnoses Final diagnoses:  Near syncope    Rx / DC Orders ED Discharge Orders     None      Maury Dus, MD PGY-2 [facility]   Maury Dus, MD 08/11/21 1735    Milagros Loll, MD 08/13/21 2226

## 2021-08-11 NOTE — Discharge Instructions (Addendum)
You were seen in the Emergency Department today for a "near-syncopal episode", which means almost passing out. The cause of this was most likely "vasovagal" which can be an exaggerated reaction to some trigger (smell, heat, emotion, blood, etc). The good news is that your EKG and labs were essentially normal and your head CT did not show any new findings (just the bony prominence that has been seen previously). Your blood pressure was elevated here. It is very important to find a primary care doctor who can prescribe your blood pressure medication and take care of you going forward.

## 2021-08-20 ENCOUNTER — Ambulatory Visit: Payer: Self-pay | Admitting: Medical

## 2021-10-08 ENCOUNTER — Emergency Department (HOSPITAL_COMMUNITY)
Admission: EM | Admit: 2021-10-08 | Discharge: 2021-10-08 | Disposition: A | Discharge status: Home / Self Care | Payer: Self-pay | Attending: Emergency Medicine | Admitting: Emergency Medicine

## 2021-10-08 ENCOUNTER — Other Ambulatory Visit: Payer: Self-pay

## 2021-10-08 DIAGNOSIS — F17200 Nicotine dependence, unspecified, uncomplicated: Secondary | ICD-10-CM | POA: Insufficient documentation

## 2021-10-08 DIAGNOSIS — E876 Hypokalemia: Secondary | ICD-10-CM | POA: Insufficient documentation

## 2021-10-08 DIAGNOSIS — I10 Essential (primary) hypertension: Secondary | ICD-10-CM | POA: Insufficient documentation

## 2021-10-08 DIAGNOSIS — R202 Paresthesia of skin: Secondary | ICD-10-CM | POA: Insufficient documentation

## 2021-10-08 LAB — CBC WITH DIFFERENTIAL/PLATELET
Abs Immature Granulocytes: 0.02 10*3/uL (ref 0.00–0.07)
Basophils Absolute: 0.1 10*3/uL (ref 0.0–0.1)
Basophils Relative: 1 %
Eosinophils Absolute: 0.3 10*3/uL (ref 0.0–0.5)
Eosinophils Relative: 4 %
HCT: 35 % — ABNORMAL LOW (ref 36.0–46.0)
Hemoglobin: 10.7 g/dL — ABNORMAL LOW (ref 12.0–15.0)
Immature Granulocytes: 0 %
Lymphocytes Relative: 37 %
Lymphs Abs: 2.3 10*3/uL (ref 0.7–4.0)
MCH: 24.3 pg — ABNORMAL LOW (ref 26.0–34.0)
MCHC: 30.6 g/dL (ref 30.0–36.0)
MCV: 79.5 fL — ABNORMAL LOW (ref 80.0–100.0)
Monocytes Absolute: 0.3 10*3/uL (ref 0.1–1.0)
Monocytes Relative: 5 %
Neutro Abs: 3.2 10*3/uL (ref 1.7–7.7)
Neutrophils Relative %: 53 %
Platelets: 330 10*3/uL (ref 150–400)
RBC: 4.4 MIL/uL (ref 3.87–5.11)
RDW: 16.7 % — ABNORMAL HIGH (ref 11.5–15.5)
WBC: 6.1 10*3/uL (ref 4.0–10.5)
nRBC: 0 % (ref 0.0–0.2)

## 2021-10-08 LAB — URINALYSIS, ROUTINE W REFLEX MICROSCOPIC
Bilirubin Urine: NEGATIVE
Glucose, UA: NEGATIVE mg/dL
Hgb urine dipstick: NEGATIVE
Ketones, ur: NEGATIVE mg/dL
Nitrite: NEGATIVE
Protein, ur: NEGATIVE mg/dL
Specific Gravity, Urine: 1.013 (ref 1.005–1.030)
pH: 5 (ref 5.0–8.0)

## 2021-10-08 LAB — BASIC METABOLIC PANEL
Anion gap: 7 (ref 5–15)
BUN: 6 mg/dL (ref 6–20)
CO2: 26 mmol/L (ref 22–32)
Calcium: 8.9 mg/dL (ref 8.9–10.3)
Chloride: 103 mmol/L (ref 98–111)
Creatinine, Ser: 0.66 mg/dL (ref 0.44–1.00)
GFR, Estimated: >60 mL/min (ref >60)
Glucose, Bld: 116 mg/dL — ABNORMAL HIGH (ref 70–99)
Potassium: 3.1 mmol/L — ABNORMAL LOW (ref 3.5–5.1)
Sodium: 136 mmol/L (ref 135–145)

## 2021-10-08 LAB — I-STAT BETA HCG BLOOD, ED (MC, WL, AP ONLY): I-stat hCG, quantitative: <5 m[IU]/mL (ref <5)

## 2021-10-08 MED ORDER — POTASSIUM CHLORIDE ER 10 MEQ PO TBCR: 10.0000 meq | EXTENDED_RELEASE_TABLET | Freq: Every day | ORAL | 0 refills | Status: DC

## 2021-10-08 MED ORDER — AMLODIPINE BESYLATE 5 MG PO TABS
5.0000 mg | ORAL_TABLET | Freq: Once | ORAL | Status: AC
  Administered 2021-10-08: 5 mg via ORAL
  Filled 2021-10-08: qty 1

## 2021-10-08 MED ORDER — AMLODIPINE BESYLATE 5 MG PO TABS: 5.0000 mg | ORAL_TABLET | Freq: Every day | ORAL | 0 refills | Status: DC

## 2021-10-08 MED ORDER — POTASSIUM CHLORIDE CRYS ER 20 MEQ PO TBCR
40.0000 meq | EXTENDED_RELEASE_TABLET | Freq: Once | ORAL | Status: AC
  Administered 2021-10-08: 40 meq via ORAL
  Filled 2021-10-08: qty 2

## 2021-10-08 MED ORDER — ACETAMINOPHEN 325 MG PO TABS
650.0000 mg | ORAL_TABLET | Freq: Once | ORAL | Status: AC
  Administered 2021-10-08: 650 mg via ORAL
  Filled 2021-10-08: qty 2

## 2021-10-08 NOTE — ED Notes (Signed)
Gave pt something to drink and eat

## 2021-10-08 NOTE — ED Triage Notes (Signed)
Pt reports hypertension, ongoing since August when she ran out of her HCTZ. Reports R sided facial numbness this morning which has since resolved.

## 2021-10-08 NOTE — ED Provider Notes (Signed)
Emergency Medicine Provider Triage Evaluation Note  Brittany Graves , a 35 y.o. female  was evaluated in triage.  Pt complains of elevated blood pressure.  Patient ran out of her blood pressure medication in August, normally takes HCTZ.  Woke up today and noticed right-sided facial tingling which has since resolved.  She denies numbness, weakness otherwise, chest pain, shortness of breath, lower extremity edema.  Is scheduled to see her doctor at the end of the month.  Review of Systems  Positive: Tingling, resolved Negative: Weakness, numbness, chest pain, shortness of breath  Physical Exam  BP (!) 157/119 (BP Location: Right Arm)   Pulse 78   Temp 98.4 F (36.9 C) (Oral)   Resp 18   SpO2 100%  Gen:   Awake, no distress   Resp:  Normal effort  MSK:   Moves extremities without difficulty  Other:  Facial movements symmetric, sensation intact, upper and lower extremity strength symmetric  Medical Decision Making  Medically screening exam initiated at 11:57 AM.  Appropriate orders placed.  Brittany Graves was informed that the remainder of the evaluation will be completed by another provider, this initial triage assessment does not replace that evaluation, and the importance of remaining in the ED until their evaluation is complete.     Jeannie Fend, PA-C 10/08/21 1158    Pollyann Savoy, MD 10/08/21 [phone]

## 2021-10-08 NOTE — Discharge Instructions (Addendum)
You came to the emergency department today to be evaluated for your high blood pressure.  Your blood pressure remained elevated throughout your time in the emergency department.  This is likely due to being off of your medications for the last 2 to 3 months.  I have started you on a new medication called Norvasc for your blood pressure.  Please take this once daily.  Please check your blood pressure at home and keep a journal of this information to bring to your primary care provider for further management of your blood pressure.  Your potassium was found to be slightly decreased.  I have given you prescription for potassium.  Please take this over the next 5 days and then follow-up with your primary care doctor or urgent care to have your potassium level rechecked.  Get help right away if you: Develop a severe headache or confusion. Have unusual weakness or numbness. Feel faint. Have severe pain in your chest or abdomen. Vomit repeatedly. Have trouble breathing.

## 2021-10-08 NOTE — ED Provider Notes (Signed)
[place] EMERGENCY DEPARTMENT Provider Note   CSN: [id number] Arrival date & time: 10/08/21  1014     History Chief Complaint  Patient presents with   Hypertension    Brittany Graves is a 35 y.o. female with a history of hypertension and tingling sensation to the right side of her face.  Patient reports that she noticed her blood pressure was elevated today with systolic numbers in the [id number].  Patient states that she has not been taking her hydrochlorothiazide medication since August.  Patient states that she woke up this morning around 0 900 and noticed tingling sensation to the right side of her face.  States that tingling sensation resolved approximately around noon.  Denied any headache, numbness, weakness, facial asymmetry, dysarthria, aphasia.  Patient denies any recent falls or injuries.  Denies any chest pain, shortness of breath, palpitations, leg swelling or tenderness.   Hypertension Pertinent negatives include no chest pain, no abdominal pain, no headaches and no shortness of breath.      Past Medical History:  Diagnosis Date   Hypertension     There are no problems to display for this patient.   Past Surgical History:  Procedure Laterality Date   TUBAL LIGATION       OB History   No obstetric history on file.     Family History  Problem Relation Age of Onset   Hypertension Mother    Diabetes Mother     Social History   Tobacco Use   Smoking status: Some Days   Smokeless tobacco: Never  Substance Use Topics   Alcohol use: No    Home Medications Prior to Admission medications   Medication Sig Start Date End Date Taking? Authorizing Provider  chlorhexidine (HIBICLENS) 4 % external liquid Apply topically daily as needed. 04/25/21   Patriciaann Clan, DO  hydrochlorothiazide (HYDRODIURIL) 25 MG tablet Take 1 tablet (25 mg total) by mouth daily. 08/14/20 10/13/20  Little, Wenda Overland, MD  HYDROcodone-acetaminophen  (NORCO/VICODIN) 5-325 MG tablet Take 1 tablet by mouth every 6 (six) hours as needed for moderate pain. 12/28/19   Fisher, Linden Dolin, PA-C  potassium chloride (MICRO-K) 10 MEQ CR capsule Take 1 capsule (10 mEq total) by mouth once. 02/16/16   Schaevitz, Randall An, MD    Allergies    Patient has no known allergies.  Review of Systems   Review of Systems  Constitutional:  Negative for chills and fever.  Eyes:  Negative for visual disturbance.  Respiratory:  Negative for shortness of breath.   Cardiovascular:  Negative for chest pain, palpitations and leg swelling.  Gastrointestinal:  Negative for abdominal pain, nausea and vomiting.  Genitourinary:  Negative for difficulty urinating and dysuria.  Musculoskeletal:  Negative for back pain and neck pain.  Skin:  Negative for color change and rash.  Neurological:  Negative for dizziness, tremors, seizures, syncope, facial asymmetry, speech difficulty, weakness, light-headedness, numbness and headaches.  Psychiatric/Behavioral:  Negative for confusion.    Physical Exam Updated Vital Signs BP (!) 161/114 (BP Location: Right Arm)   Pulse 88   Temp 98.4 F (36.9 C) (Oral)   Resp 18   SpO2 100%   Physical Exam Vitals and nursing note reviewed.  Constitutional:      General: She is not in acute distress.    Appearance: She is not ill-appearing, toxic-appearing or diaphoretic.  HENT:     Head: Normocephalic. No raccoon eyes, abrasion, contusion, right periorbital erythema, left periorbital erythema or laceration.  Eyes:     General: No scleral icterus.       Right eye: No discharge.        Left eye: No discharge.     Extraocular Movements: Extraocular movements intact.     Conjunctiva/sclera: Conjunctivae normal.     Pupils: Pupils are equal, round, and reactive to light.  Cardiovascular:     Rate and Rhythm: Normal rate.  Pulmonary:     Effort: Pulmonary effort is normal. No tachypnea, bradypnea or respiratory distress.     Breath  sounds: Normal breath sounds. No stridor.  Abdominal:     Palpations: Abdomen is soft.     Tenderness: There is no abdominal tenderness.  Musculoskeletal:     Cervical back: Normal range of motion and neck supple. No rigidity.     Right lower leg: No swelling or tenderness. No edema.     Left lower leg: No swelling or tenderness. No edema.  Skin:    General: Skin is warm and dry.  Neurological:     General: No focal deficit present.     Mental Status: She is alert and oriented to person, place, and time.     GCS: GCS eye subscore is 4. GCS verbal subscore is 5. GCS motor subscore is 6.     Cranial Nerves: No cranial nerve deficit or facial asymmetry.     Motor: No weakness, tremor, seizure activity or pronator drift.     Coordination: Romberg sign negative. Finger-Nose-Finger Test normal.     Gait: Gait is intact. Gait normal.     Comments: CN II-XII intact, equal grip strength, +5 strength to bilateral upper and lower extremities, patient able to stand and ambulate without difficulty.  Reports decreased sensation to right hand however states that is baseline for her   Psychiatric:        Behavior: Behavior is cooperative.    ED Results / Procedures / Treatments   Labs (all labs ordered are listed, but only abnormal results are displayed) Labs Reviewed  BASIC METABOLIC PANEL - Abnormal; Notable for the following components:      Result Value   Potassium 3.1 (*)    Glucose, Bld 116 (*)    All other components within normal limits  CBC WITH DIFFERENTIAL/PLATELET - Abnormal; Notable for the following components:   Hemoglobin 10.7 (*)    HCT 35.0 (*)    MCV 79.5 (*)    MCH 24.3 (*)    RDW 16.7 (*)    All other components within normal limits  URINALYSIS, ROUTINE W REFLEX MICROSCOPIC - Abnormal; Notable for the following components:   APPearance HAZY (*)    Leukocytes,Ua SMALL (*)    Bacteria, UA RARE (*)    All other components within normal limits  I-STAT BETA HCG BLOOD, ED  ([facility], [facility], [facility] ONLY)    EKG None  Radiology No results found.  Procedures Procedures   Medications Ordered in ED Medications  acetaminophen (TYLENOL) tablet 650 mg (has no administration in time range)    ED Course  I have reviewed the triage vital signs and the nursing notes.  Pertinent labs & imaging results that were available during my care of the patient were reviewed by me and considered in my medical decision making (see chart for details).    MDM Rules/Calculators/A&P                           Alert 35 year old female in  no acute distress, nontoxic-appearing.  Presents to ED with chief complaint of hypertension and tingling sensation to right side of her face.  Patient has history of hypertension however is noncompliant with medication since August.  Patient previously taking hydrochlorothiazide.  Blood pressure found to be elevated while in emergency department.  Lab work shows hypokalemia at 3.1.  Due to patient's hypokalemia there is concern to restart her on hydrochlorothiazide.  Spoke to patient about starting Norvasc for hypertension management and she is agreeable to this plan.  Patient states that she has follow-up with PCP this month.  Will prescribe patient with 30-day prescription for Norvasc.  Due to hypokalemia we will give patient oral repletion in the ED.  Will prescribe patient with short course of potassium.  Patient to follow-up with PCP or urgent care to have potassium levels rechecked.  Suspect that patient's paresthesia was secondary to her elevated blood pressure.  Patient has no focal neurological deficits on exam.  Importance of PCP follow-up was stressed to the patient.  Importance of hypertension management was stressed to the patient.  Patient care discussed with attending physician Dr.Havilland   Discussed results, findings, treatment and follow up. Patient advised of return precautions. Patient verbalized understanding and agreed with plan.   Final  Clinical Impression(s) / ED Diagnoses Final diagnoses:  Hypertension, unspecified type  Paresthesia  Hypokalemia    Rx / DC Orders ED Discharge Orders          Ordered    amLODipine (NORVASC) 5 MG tablet  Daily        10/08/21 1905    potassium chloride (KLOR-CON) 10 MEQ tablet  Daily        10/08/21 1905             Dyann Ruddle 10/08/21 1916    Isla Pence, MD 10/08/21 1916

## 2021-10-08 NOTE — ED Notes (Signed)
Stepped outside for a min

## 2021-10-14 ENCOUNTER — Ambulatory Visit: Payer: Self-pay | Admitting: Medical

## 2022-12-27 IMAGING — CT CT HEAD W/O CM
4 of 5 series · 15 of 47 positions shown, 17 images · non-contrast
Comparison: Head CT 02/03/2018.

CLINICAL DATA: Dizziness, nonspecific. Additional provided:
Lightheadedness and dizziness, history of hypertension.

EXAM:
CT HEAD WITHOUT CONTRAST
TECHNIQUE: Contiguous axial images were obtained from the base of the skull
through the vertex without intravenous contrast.

[Series 2: head without · axial · non-contrast · 0.42mm/px · z∈[-102,-12]mm · 4 of 31 slices shown]
[im 7/31  brain]
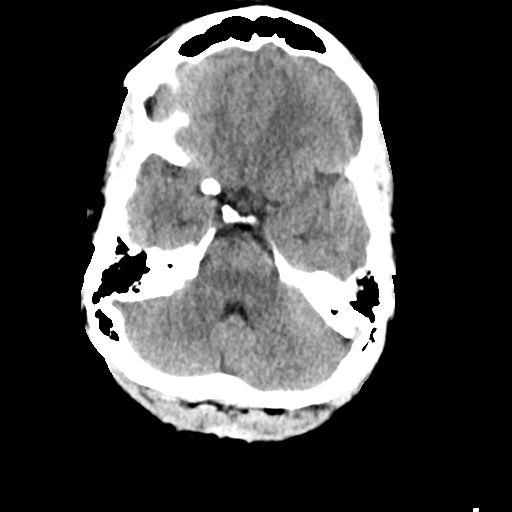
[im 13/31  brain]
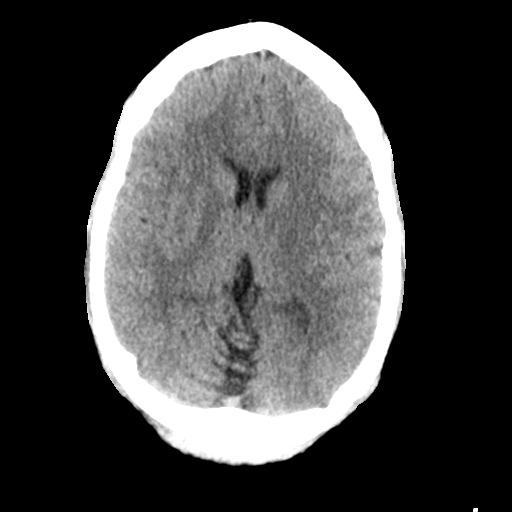
[im 19/31  brain]
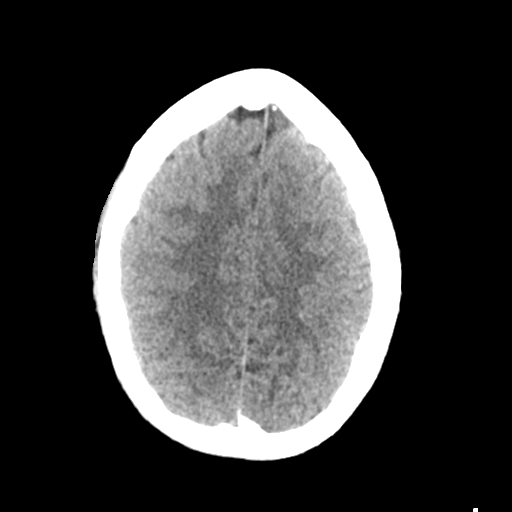
[im 25/31  brain]
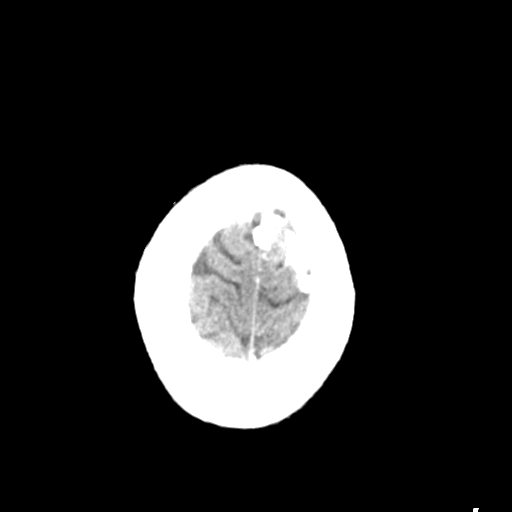

[Series 4: head without ax · axial · non-contrast · 0.32mm/px · z∈[-139,-39]mm · 5 of 31 slices shown, 7 images]
[im 6/31  brain]
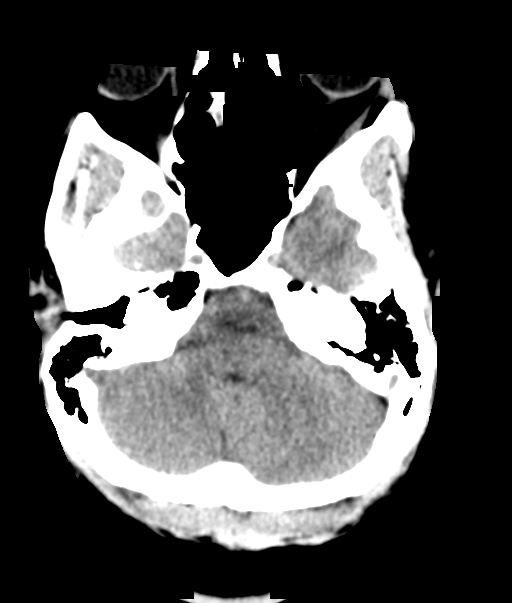
[im 6/31  bone]
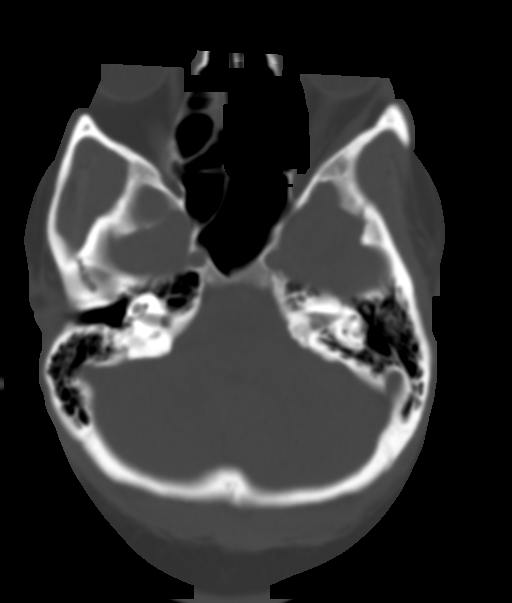
[im 11/31  brain]
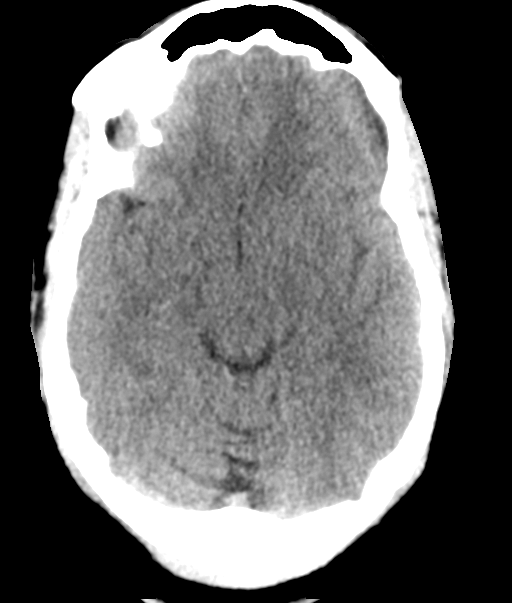
[im 16/31  brain]
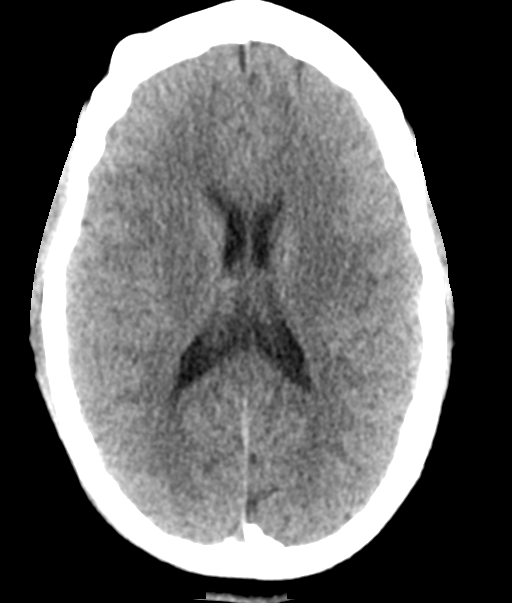
[im 21/31  brain]
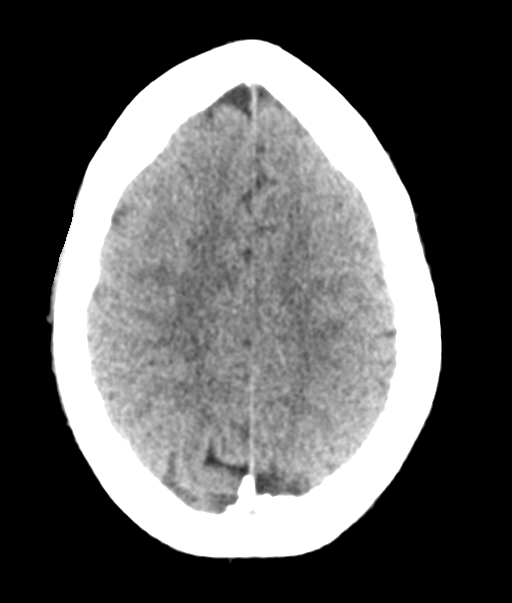
[im 26/31  brain]
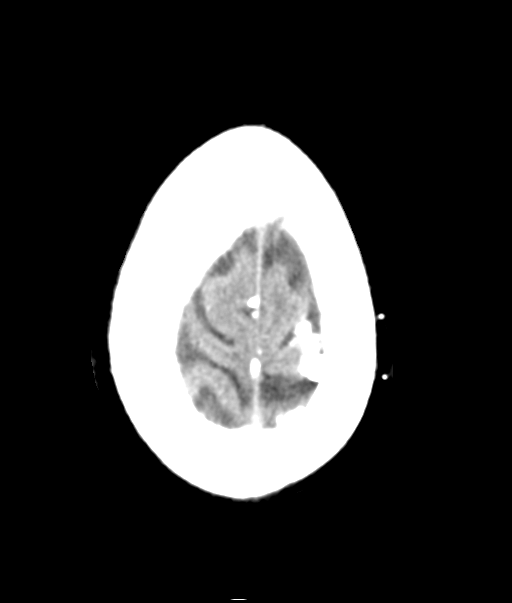
[im 26/31  bone]
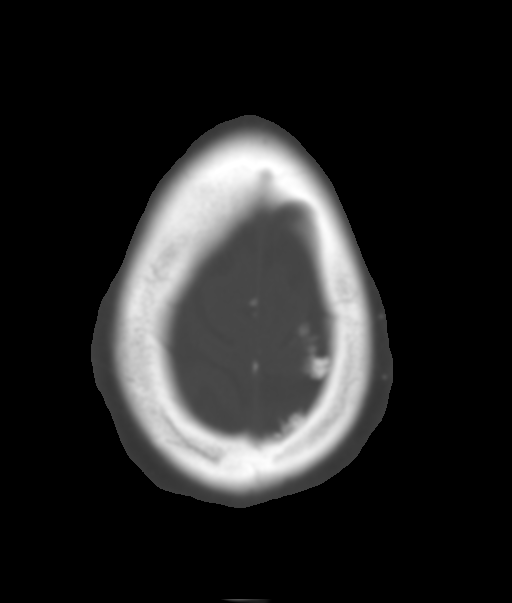

[Series 5: head without cor · coronal · non-contrast · 0.29mm/px · 3 of 65 slices shown]
[im 22/65  brain]
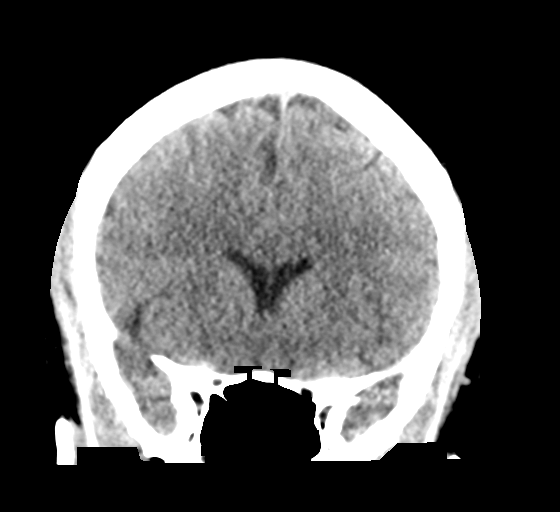
[im 29/65  brain]
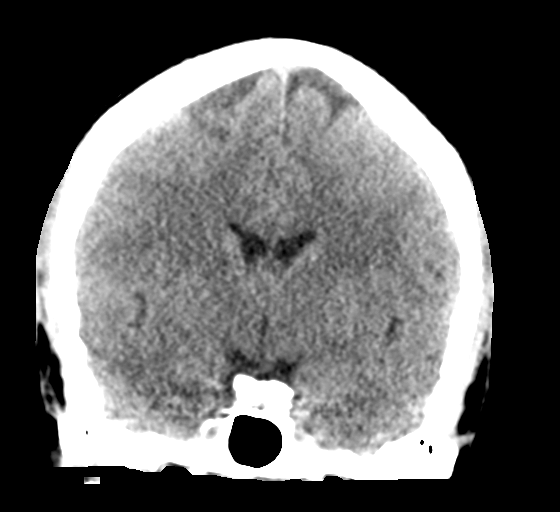
[im 36/65  brain]
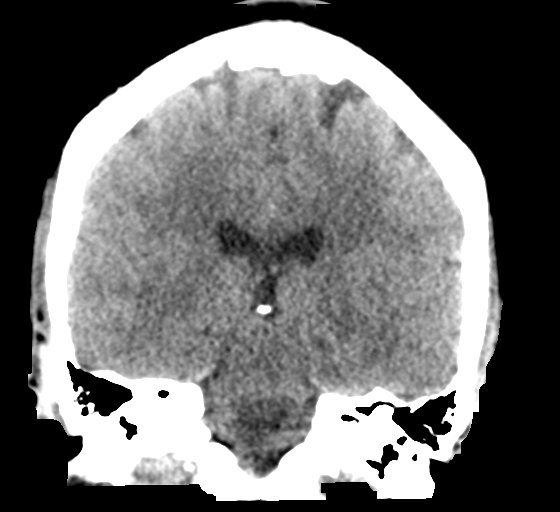

[Series 6: head without sag · sagittal · non-contrast · 0.29mm/px · 3 of 55 slices shown]
[im 19/55  brain]
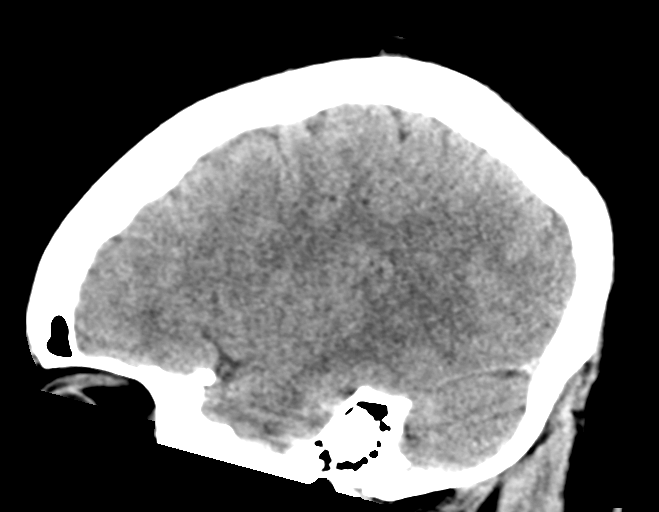
[im 28/55  brain]
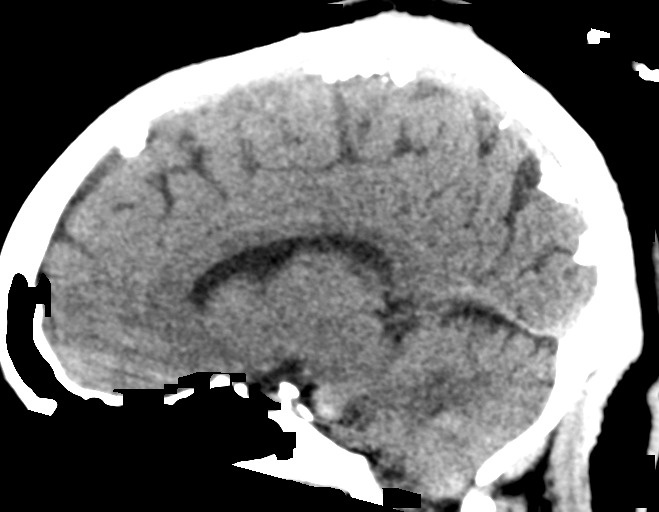
[im 37/55  brain]
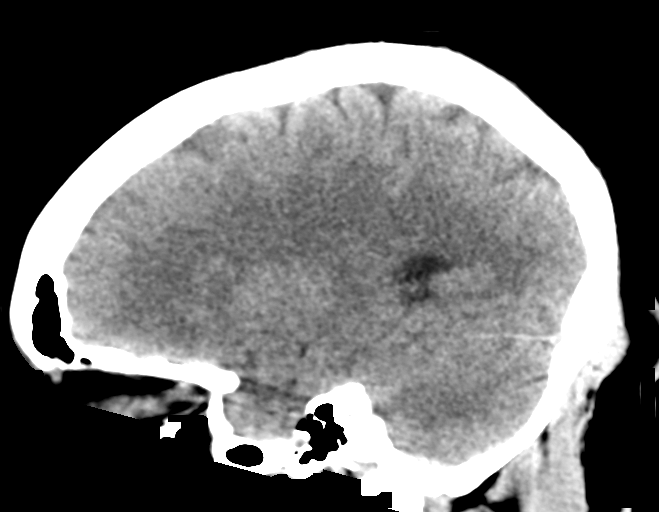

[15 of 47 positions shown; findings below may reference images not displayed]

FINDINGS: Brain:

Cerebral volume is normal.

There is no acute intracranial hemorrhage.

No demarcated cortical infarct.

No extra-axial fluid collection.

No evidence of an intracranial mass.

No midline shift.

Partially empty sella turcica.

Prominent dural calcifications.

Vascular: No hyperdense vessel

Skull: No calvarial fracture. 12 mm bony protuberance projecting
outward from the right frontal calvarium, likely reflecting an
incidental osteoma (series 3, image 26).

Sinuses/Orbits: Visualized orbits show no acute finding. Mild
bilateral ethmoid and sphenoid sinus mucosal thickening at the
imaged levels.
IMPRESSION: No evidence of acute intracranial abnormality.

Redemonstrated partially empty sella turcica. This finding is very
commonly incidental, but can be associated with idiopathic
intracranial hypertension.

Redemonstrated 12 mm bony protuberance projecting outward from the
right frontal calvarium, likely reflecting an incidental osteoma.

Mild bilateral ethmoid and sphenoid sinus mucosal thickening at the
imaged levels.

## 2023-03-03 ENCOUNTER — Emergency Department (HOSPITAL_COMMUNITY)
Admission: EM | Admit: 2023-03-03 | Discharge: 2023-03-03 | Disposition: A | Discharge status: Home / Self Care | Payer: Medicaid Other | Attending: Emergency Medicine | Admitting: Emergency Medicine

## 2023-03-03 ENCOUNTER — Other Ambulatory Visit: Payer: Self-pay

## 2023-03-03 ENCOUNTER — Emergency Department (HOSPITAL_COMMUNITY): Payer: Medicaid Other

## 2023-03-03 DIAGNOSIS — Z79899 Other long term (current) drug therapy: Secondary | ICD-10-CM | POA: Diagnosis not present

## 2023-03-03 DIAGNOSIS — E876 Hypokalemia: Secondary | ICD-10-CM

## 2023-03-03 DIAGNOSIS — I10 Essential (primary) hypertension: Secondary | ICD-10-CM | POA: Insufficient documentation

## 2023-03-03 DIAGNOSIS — D500 Iron deficiency anemia secondary to blood loss (chronic): Secondary | ICD-10-CM | POA: Diagnosis not present

## 2023-03-03 DIAGNOSIS — R519 Headache, unspecified: Secondary | ICD-10-CM | POA: Diagnosis present

## 2023-03-03 LAB — CBC
HCT: 28.7 % — ABNORMAL LOW (ref 36.0–46.0)
Hemoglobin: 7.9 g/dL — ABNORMAL LOW (ref 12.0–15.0)
MCH: 18.3 pg — ABNORMAL LOW (ref 26.0–34.0)
MCHC: 27.5 g/dL — ABNORMAL LOW (ref 30.0–36.0)
MCV: 66.6 fL — ABNORMAL LOW (ref 80.0–100.0)
Platelets: 437 10*3/uL — ABNORMAL HIGH (ref 150–400)
RBC: 4.31 MIL/uL (ref 3.87–5.11)
RDW: 19.4 % — ABNORMAL HIGH (ref 11.5–15.5)
WBC: 7.2 10*3/uL (ref 4.0–10.5)
nRBC: 0 % (ref 0.0–0.2)

## 2023-03-03 LAB — BASIC METABOLIC PANEL
Anion gap: 8 (ref 5–15)
BUN: 6 mg/dL (ref 6–20)
CO2: 26 mmol/L (ref 22–32)
Calcium: 9 mg/dL (ref 8.9–10.3)
Chloride: 103 mmol/L (ref 98–111)
Creatinine, Ser: 0.65 mg/dL (ref 0.44–1.00)
GFR, Estimated: >60 mL/min (ref >60)
Glucose, Bld: 94 mg/dL (ref 70–99)
Potassium: 3 mmol/L — ABNORMAL LOW (ref 3.5–5.1)
Sodium: 137 mmol/L (ref 135–145)

## 2023-03-03 LAB — I-STAT BETA HCG BLOOD, ED (MC, WL, AP ONLY): I-stat hCG, quantitative: <5 m[IU]/mL (ref <5)

## 2023-03-03 MED ORDER — POTASSIUM CHLORIDE 10 MEQ/100ML IV SOLN
10.0000 meq | Freq: Once | INTRAVENOUS | Status: AC
  Administered 2023-03-03: 10 meq via INTRAVENOUS
  Filled 2023-03-03: qty 100

## 2023-03-03 MED ORDER — SODIUM CHLORIDE 0.9 % IV BOLUS
1000.0000 mL | Freq: Once | INTRAVENOUS | Status: AC
  Administered 2023-03-03: 1000 mL via INTRAVENOUS

## 2023-03-03 MED ORDER — FERROUS SULFATE 325 (65 FE) MG PO TABS: 325.0000 mg | ORAL_TABLET | Freq: Every day | ORAL | 0 refills | Status: DC

## 2023-03-03 MED ORDER — HYDROCHLOROTHIAZIDE 12.5 MG PO TABS
12.5000 mg | ORAL_TABLET | Freq: Every day | ORAL | Status: DC
  Administered 2023-03-03: 12.5 mg via ORAL
  Filled 2023-03-03: qty 1

## 2023-03-03 MED ORDER — HYDROCHLOROTHIAZIDE 25 MG PO TABS: 25.0000 mg | ORAL_TABLET | Freq: Every day | ORAL | 0 refills | Status: DC

## 2023-03-03 MED ORDER — KETOROLAC TROMETHAMINE 15 MG/ML IJ SOLN
15.0000 mg | Freq: Once | INTRAMUSCULAR | Status: AC
  Administered 2023-03-03: 15 mg via INTRAMUSCULAR
  Filled 2023-03-03: qty 1

## 2023-03-03 MED ORDER — POTASSIUM CHLORIDE ER 10 MEQ PO TBCR: 20.0000 meq | EXTENDED_RELEASE_TABLET | Freq: Every day | ORAL | 0 refills | Status: DC

## 2023-03-03 MED ORDER — POTASSIUM CHLORIDE CRYS ER 20 MEQ PO TBCR
40.0000 meq | EXTENDED_RELEASE_TABLET | Freq: Once | ORAL | Status: AC
  Administered 2023-03-03: 40 meq via ORAL
  Filled 2023-03-03: qty 2

## 2023-03-03 MED ORDER — ACETAMINOPHEN 325 MG PO TABS
650.0000 mg | ORAL_TABLET | Freq: Once | ORAL | Status: AC
Start: 2023-03-03 — End: 2023-03-03
  Administered 2023-03-03: 650 mg via ORAL
  Filled 2023-03-03: qty 2

## 2023-03-03 MED ORDER — METOCLOPRAMIDE HCL 10 MG PO TABS
10.0000 mg | ORAL_TABLET | Freq: Once | ORAL | Status: AC
  Administered 2023-03-03: 10 mg via ORAL
  Filled 2023-03-03: qty 1

## 2023-03-03 NOTE — Social Work (Signed)
Resources are in the patients chart to establish PCP

## 2023-03-03 NOTE — Discharge Instructions (Addendum)
You had significant iron deficiency anemia today, this is likely secondary to your heavy menstrual cycle, please begin taking iron supplementation, I recommend close follow-up after you establish a primary care doctor to ensure your anemia is not worsening, you had low potassium today, we repleted it in the emergency department, but I recommend that you begin taking supplementation especially as the blood pressure medication that you should begin taking causes some potassium loss in your urine.  Please return to the emergency department if your symptoms worsen despite treatment.

## 2023-03-03 NOTE — ED Provider Notes (Signed)
[facility] Provider Note   CSN: [id number] Arrival date & time: 03/03/23  1657     History  Chief Complaint  Patient presents with   Headache    Brittany Graves is a 37 y.o. female with past medical history significant for hypertension, patient reports that she has been unable to establish a primary care doctor and says she has not been taking medication, she reports that she was supposed to be taking hydrochlorothiazide.  She reports headache for around a week worse on the right with tightness, pressure, she endorses some photosensitivity, occasional nausea, she reports that she has a bump on her head on the right that seems that it has grown or there is more pressure behind it.  She has not taken anything for the pain at this time.   Headache      Home Medications Prior to Admission medications   Medication Sig Start Date End Date Taking? Authorizing Provider  ferrous sulfate 325 (65 FE) MG tablet Take 1 tablet (325 mg total) by mouth daily. 03/03/23  Yes Eitan Doubleday H, PA-C  hydrochlorothiazide (HYDRODIURIL) 25 MG tablet Take 1 tablet (25 mg total) by mouth daily. 03/03/23  Yes Carrianne Hyun H, PA-C  potassium chloride (KLOR-CON) 10 MEQ tablet Take 2 tablets (20 mEq total) by mouth daily. 03/03/23  Yes Cheyene Hamric H, PA-C  amLODipine (NORVASC) 5 MG tablet Take 1 tablet (5 mg total) by mouth daily. 10/08/21 11/07/21  Loni Beckwith, PA-C  chlorhexidine (HIBICLENS) 4 % external liquid Apply topically daily as needed. 04/25/21   Patriciaann Clan, DO  HYDROcodone-acetaminophen (NORCO/VICODIN) 5-325 MG tablet Take 1 tablet by mouth every 6 (six) hours as needed for moderate pain. 12/28/19   Versie Starks, PA-C      Allergies    Patient has no known allergies.    Review of Systems   Review of Systems  Neurological:  Positive for headaches.  All other systems reviewed and are negative.   Physical  Exam Updated Vital Signs BP (!) 152/90 (BP Location: Right Arm)   Pulse 83   Temp 98.2 F (36.8 C) (Oral)   Resp 16   Ht 5\' 4"  (1.626 m)   Wt 106.6 kg   SpO2 100%   BMI 40.34 kg/m  Physical Exam Vitals and nursing note reviewed.  Constitutional:      General: She is not in acute distress.    Appearance: Normal appearance.  HENT:     Head: Normocephalic and atraumatic.  Eyes:     General:        Right eye: No discharge.        Left eye: No discharge.  Cardiovascular:     Rate and Rhythm: Normal rate and regular rhythm.     Heart sounds: No murmur heard.    No friction rub. No gallop.  Pulmonary:     Effort: Pulmonary effort is normal.     Breath sounds: Normal breath sounds.  Abdominal:     General: Bowel sounds are normal.     Palpations: Abdomen is soft.  Skin:    General: Skin is warm and dry.     Capillary Refill: Capillary refill takes less than 2 seconds.     Comments: Patient with firm nodule on right frontal forehead consistent with cyst vs lipoma vs other, no overlying skin changes  Neurological:     Mental Status: She is alert and oriented to person, place, and time.  Comments: Moves all 4 limbs spontaneously, CN II through XII grossly intact, can ambulate without difficulty, intact sensation throughout.   Psychiatric:        Mood and Affect: Mood normal.        Behavior: Behavior normal.     ED Results / Procedures / Treatments   Labs (all labs ordered are listed, but only abnormal results are displayed) Labs Reviewed  BASIC METABOLIC PANEL - Abnormal; Notable for the following components:      Result Value   Potassium 3.0 (*)    All other components within normal limits  CBC - Abnormal; Notable for the following components:   Hemoglobin 7.9 (*)    HCT 28.7 (*)    MCV 66.6 (*)    MCH 18.3 (*)    MCHC 27.5 (*)    RDW 19.4 (*)    Platelets 437 (*)    All other components within normal limits  I-STAT BETA HCG BLOOD, ED ([facility], [facility], [facility] ONLY)     EKG None  Radiology CT Head Wo Contrast  Result Date: 03/03/2023 CLINICAL DATA:  Headache EXAM: CT HEAD WITHOUT CONTRAST TECHNIQUE: Contiguous axial images were obtained from the base of the skull through the vertex without intravenous contrast. RADIATION DOSE REDUCTION: This exam was performed according to the departmental dose-optimization program which includes automated exposure control, adjustment of the mA and/or kV according to patient size and/or use of iterative reconstruction technique. COMPARISON:  None Available. FINDINGS: Brain: There is no mass, hemorrhage or extra-axial collection. The size and configuration of the ventricles and extra-axial CSF spaces are normal. The brain parenchyma is normal, without acute or chronic infarction. Vascular: No abnormal hyperdensity of the major intracranial arteries or dural venous sinuses. No intracranial atherosclerosis. Skull: The visualized skull base, calvarium and extracranial soft tissues are normal. Sinuses/Orbits: No fluid levels or advanced mucosal thickening of the visualized paranasal sinuses. No mastoid or middle ear effusion. The orbits are normal. IMPRESSION: Normal head CT. Electronically Signed   By: Ulyses Jarred M.D.   On: 03/03/2023 19:02    Procedures Procedures    Medications Ordered in ED Medications  hydrochlorothiazide (HYDRODIURIL) tablet 12.5 mg (12.5 mg Oral Given 03/03/23 1821)  ketorolac (TORADOL) 15 MG/ML injection 15 mg (15 mg Intramuscular Given 03/03/23 1823)  metoCLOPramide (REGLAN) tablet 10 mg (10 mg Oral Given 03/03/23 1821)  acetaminophen (TYLENOL) tablet 650 mg (650 mg Oral Given 03/03/23 1848)  potassium chloride SA (KLOR-CON M) CR tablet 40 mEq (40 mEq Oral Given 03/03/23 1945)  sodium chloride 0.9 % bolus 1,000 mL (0 mLs Intravenous Stopped 03/03/23 2156)  potassium chloride 10 mEq in 100 mL IVPB (0 mEq Intravenous Stopped 03/03/23 2155)    ED Course/ Medical Decision Making/ A&P Clinical Course as of  03/03/23 2304  Tue Mar 03, 2023  1829 Hemoglobin 7.9 with significant microcytic quality, patient with history of anemia, she denies any new sources of bleeding, likely vaginal losses.  Could be contributing to general fatigue, although likely not her head pressure specifically [CP]  1901 Patient also with hypokalemia, potassium 3.0.  We will orally replete. [CP]  1919 Significant anemia I do think that she would benefit from IV fluids, 1 dose of IV potassium. Patient still feeling somewhat dizzy, weak, given towards normal especially as we give her her home hydrochlorothiazide.  She will be discharged with iron supplementation, refill for blood pressure medication, and potassium supplementation.   [CP]    Clinical Course User Index [CP] Carlus Pavlov, Kenyana Husak  Lemmie Evens, PA-C                             Medical Decision Making Amount and/or Complexity of Data Reviewed Labs: ordered. Radiology: ordered.  Risk OTC drugs. Prescription drug management.   This patient is a 37 y.o. female  who presents to the ED for concern of headache, fatigue, weakness.   Differential diagnoses prior to evaluation: The emergent differential diagnosis includes, but is not limited to,  CVA, spinal cord injury, ACS, arrhythmia, syncope, orthostatic hypotension, sepsis, hypoglycemia, hypoxia, electrolyte disturbance, endocrine disorder, anemia, environmental exposure, polypharmacy . This is not an exhaustive differential.   Past Medical History / Co-morbidities:  Previous history of hypertension, anemia, hypokalemia  Additional history: Chart reviewed. Pertinent results include: Reviewed lab work, imaging from previous emergency department visits remotely, patient has not been seen in the last year.  Physical Exam: Physical exam performed. The pertinent findings include: Patient with no acute neurologic deficits, she has mild tachycardia, heart rate 102, she is hypertensive with blood pressure 174/107.   Lab  Tests/Imaging studies: I personally interpreted labs/imaging and the pertinent results include: See above, anemia, hypokalemia, microcytic quality of anemia likely from chronic blood loss, iron deficiency anemia, BMP, CBC overall unremarkable otherwise.  I dependently interpreted CT head without contrast, no acute intracranial explanation for patient's symptoms, the small lesion on her forehead seems consistent with cystic structure versus lipoma.  I agree with the radiologist interpretation.   Medications: I ordered medication including fluid bolus, Toradol, Reglan, and Tylenol for headache, dizziness, potassium for hypokalemia, hydrochlorothiazide for hypertension on arrival, her blood pressure is still somewhat elevated at time of discharge, blood pressure 152/90 but significantly improved from initial evaluation..  I have reviewed the patients home medicines and have made adjustments as needed.   Disposition: After consideration of the diagnostic results and the patients response to treatment, I feel that patient is able for discharge, she does not have a PCP at this time, placed a consult to help her get a PCP established, given her significant anemia, hypokalemia, poorly controlled hypertension discussed that she will need PCP follow-up somewhat urgently.  Patient understands and agrees to plan, extensive return precautions given.   emergency department workup does not suggest an emergent condition requiring admission or immediate intervention beyond what has been performed at this time. The plan is: as above. The patient is safe for discharge and has been instructed to return immediately for worsening symptoms, change in symptoms or any other concerns.  Final Clinical Impression(s) / ED Diagnoses Final diagnoses:  Iron deficiency anemia due to chronic blood loss  Acute nonintractable headache, unspecified headache type  Hypokalemia    Rx / DC Orders ED Discharge Orders          Ordered     ferrous sulfate 325 (65 FE) MG tablet  Daily        03/03/23 2131    hydrochlorothiazide (HYDRODIURIL) 25 MG tablet  Daily        03/03/23 2131    potassium chloride (KLOR-CON) 10 MEQ tablet  Daily        03/03/23 2131              Anselmo Pickler, PA-C 03/03/23 2304    Wyvonnia Dusky, MD 03/04/23 [phone]

## 2023-03-03 NOTE — ED Triage Notes (Addendum)
Pt c/o headache for approx 1 week. Endorses sensitivity to light and intermittent blurry vision. Pt also states she has painful knot on right side of head for approx 1 year that has increased in size recently. Denies any recent injury.

## 2023-03-23 ENCOUNTER — Emergency Department (HOSPITAL_COMMUNITY): Payer: Medicaid Other

## 2023-03-23 ENCOUNTER — Emergency Department (HOSPITAL_COMMUNITY)
Admission: EM | Admit: 2023-03-23 | Discharge: 2023-03-23 | Disposition: A | Discharge status: Home / Self Care | Payer: Medicaid Other | Attending: Emergency Medicine | Admitting: Emergency Medicine

## 2023-03-23 ENCOUNTER — Encounter (HOSPITAL_COMMUNITY): Payer: Self-pay

## 2023-03-23 DIAGNOSIS — R079 Chest pain, unspecified: Secondary | ICD-10-CM | POA: Insufficient documentation

## 2023-03-23 DIAGNOSIS — M79651 Pain in right thigh: Secondary | ICD-10-CM | POA: Diagnosis not present

## 2023-03-23 DIAGNOSIS — I10 Essential (primary) hypertension: Secondary | ICD-10-CM | POA: Insufficient documentation

## 2023-03-23 DIAGNOSIS — R1030 Lower abdominal pain, unspecified: Secondary | ICD-10-CM | POA: Diagnosis not present

## 2023-03-23 DIAGNOSIS — E876 Hypokalemia: Secondary | ICD-10-CM | POA: Diagnosis not present

## 2023-03-23 DIAGNOSIS — Z79899 Other long term (current) drug therapy: Secondary | ICD-10-CM | POA: Diagnosis not present

## 2023-03-23 DIAGNOSIS — D500 Iron deficiency anemia secondary to blood loss (chronic): Secondary | ICD-10-CM | POA: Diagnosis not present

## 2023-03-23 LAB — CBC WITH DIFFERENTIAL/PLATELET
Abs Immature Granulocytes: 0 10*3/uL (ref 0.00–0.07)
Basophils Absolute: 0.1 10*3/uL (ref 0.0–0.1)
Basophils Relative: 1 %
Eosinophils Absolute: 0.3 10*3/uL (ref 0.0–0.5)
Eosinophils Relative: 5 %
HCT: 30.1 % — ABNORMAL LOW (ref 36.0–46.0)
Hemoglobin: 8.5 g/dL — ABNORMAL LOW (ref 12.0–15.0)
Lymphocytes Relative: 39 %
Lymphs Abs: 2.5 10*3/uL (ref 0.7–4.0)
MCH: 19.1 pg — ABNORMAL LOW (ref 26.0–34.0)
MCHC: 28.2 g/dL — ABNORMAL LOW (ref 30.0–36.0)
MCV: 67.5 fL — ABNORMAL LOW (ref 80.0–100.0)
Monocytes Absolute: 0.3 10*3/uL (ref 0.1–1.0)
Monocytes Relative: 5 %
Neutro Abs: 3.2 10*3/uL (ref 1.7–7.7)
Neutrophils Relative %: 50 %
Platelets: 433 10*3/uL — ABNORMAL HIGH (ref 150–400)
RBC: 4.46 MIL/uL (ref 3.87–5.11)
RDW: 21.3 % — ABNORMAL HIGH (ref 11.5–15.5)
WBC: 6.4 10*3/uL (ref 4.0–10.5)
nRBC: 0 % (ref 0.0–0.2)
nRBC: 0 /100 WBC

## 2023-03-23 LAB — COMPREHENSIVE METABOLIC PANEL
ALT: 19 U/L (ref 0–44)
AST: 20 U/L (ref 15–41)
Albumin: 3.6 g/dL (ref 3.5–5.0)
Alkaline Phosphatase: 80 U/L (ref 38–126)
Anion gap: 12 (ref 5–15)
BUN: 7 mg/dL (ref 6–20)
CO2: 23 mmol/L (ref 22–32)
Calcium: 9.1 mg/dL (ref 8.9–10.3)
Chloride: 101 mmol/L (ref 98–111)
Creatinine, Ser: 0.8 mg/dL (ref 0.44–1.00)
GFR, Estimated: >60 mL/min (ref >60)
Glucose, Bld: 115 mg/dL — ABNORMAL HIGH (ref 70–99)
Potassium: 3 mmol/L — ABNORMAL LOW (ref 3.5–5.1)
Sodium: 136 mmol/L (ref 135–145)
Total Bilirubin: 0.6 mg/dL (ref 0.3–1.2)
Total Protein: 7.2 g/dL (ref 6.5–8.1)

## 2023-03-23 LAB — I-STAT BETA HCG BLOOD, ED (MC, WL, AP ONLY): I-stat hCG, quantitative: <5 m[IU]/mL (ref <5)

## 2023-03-23 LAB — D-DIMER, QUANTITATIVE: D-Dimer, Quant: 0.31 ug/mL-FEU (ref 0.00–0.50)

## 2023-03-23 LAB — TROPONIN I (HIGH SENSITIVITY)
Troponin I (High Sensitivity): 4 ng/L (ref <18)
Troponin I (High Sensitivity): 4 ng/L (ref <18)

## 2023-03-23 MED ORDER — ONDANSETRON HCL 4 MG/2ML IJ SOLN
4.0000 mg | Freq: Once | INTRAMUSCULAR | Status: AC
  Administered 2023-03-23: 4 mg via INTRAVENOUS
  Filled 2023-03-23: qty 2

## 2023-03-23 MED ORDER — FERROUS SULFATE 325 (65 FE) MG PO TABS: 325.0000 mg | ORAL_TABLET | Freq: Every day | ORAL | 0 refills | Status: DC

## 2023-03-23 MED ORDER — KETOROLAC TROMETHAMINE 15 MG/ML IJ SOLN
15.0000 mg | Freq: Once | INTRAMUSCULAR | Status: AC
  Administered 2023-03-23: 15 mg via INTRAVENOUS
  Filled 2023-03-23: qty 1

## 2023-03-23 MED ORDER — MORPHINE SULFATE (PF) 4 MG/ML IV SOLN
4.0000 mg | Freq: Once | INTRAVENOUS | Status: AC
  Administered 2023-03-23: 4 mg via INTRAVENOUS
  Filled 2023-03-23: qty 1

## 2023-03-23 MED ORDER — POTASSIUM CHLORIDE CRYS ER 20 MEQ PO TBCR
40.0000 meq | EXTENDED_RELEASE_TABLET | Freq: Once | ORAL | Status: AC
  Administered 2023-03-23: 40 meq via ORAL
  Filled 2023-03-23: qty 2

## 2023-03-23 NOTE — ED Notes (Signed)
Help get patient undressed on the monitor did EKG shown to Dr Anitra Lauth patient is resting with call bell in reach

## 2023-03-23 NOTE — Progress Notes (Signed)
   03/23/23 1500  Spiritual Encounters  Type of Visit Initial  Care provided to: Patient  Referral source Code page  Reason for visit Routine spiritual support  OnCall Visit No  Spiritual Framework  Presenting Themes Meaning/purpose/sources of inspiration  Interventions  Spiritual Care Interventions Made Compassionate presence;Reflective listening  Intervention Outcomes  Outcomes Awareness of support   Ch responded to rapid response page. Pt said  that she felt weak. She was concerned and became scared when she began to have chest pain. It was unexpected. Pt expressed appreciation. Ch facilitated communication between pt and coworkers. No follow-up needed at this time.

## 2023-03-23 NOTE — ED Provider Notes (Signed)
[place] EMERGENCY DEPARTMENT AT [facility] Provider Note   CSN: [id number] Arrival date & time: 03/23/23  7939     History  Chief Complaint  Patient presents with   Chest Pain    Vida L Wigand is a 37 y.o. female.  Patient is a 37 year old female with a history of anemia from heavy vaginal bleeding, hypertension and hypokalemia who is presenting today with complaint of sudden onset of chest pain.  Patient works at [employer] and reports she was finishing her paperwork in the office and when she walked out of the office she developed the severe discomfort in her chest.  She describes it as a sharp heavy type pain that goes across her chest and makes her feel somewhat short of breath.  The pain does not radiate into her back or abdomen but she does report that her cycle started yesterday and she is having her typical abdominal cramping.  She has not had cough congestion, cold-like symptoms or fever recently.  Taking deep breaths did not seem to worsen the discomfort.  She does smoke cigarettes but denies any marijuana or alcohol use.  She has been compliant with her blood pressure medications.  She also reports for the last few months she has had pain in her right thigh and leg that she attributed to working.  She has not noticed any swelling denies history of blood clots and does not take OCPs.  No family history of PE or DVT.  Her mom in her 2s did have to get a stent in her heart.  The history is provided by the patient.  Chest Pain      Home Medications Prior to Admission medications   Medication Sig Start Date End Date Taking? Authorizing Provider  ferrous sulfate 325 (65 FE) MG tablet Take 1 tablet (325 mg total) by mouth daily. 03/23/23  Yes Karcyn Menn, Alphonzo Lemmings, MD  hydrochlorothiazide (HYDRODIURIL) 25 MG tablet Take 1 tablet (25 mg total) by mouth daily. 03/03/23  Yes Prosperi, Christian H, PA-C  potassium chloride (KLOR-CON) 10 MEQ tablet Take 2 tablets (20 mEq total) by  mouth daily. Patient taking differently: Take 10 mEq by mouth daily. 03/03/23  Yes Prosperi, Christian H, PA-C  amLODipine (NORVASC) 5 MG tablet Take 1 tablet (5 mg total) by mouth daily. Patient not taking: Reported on 03/23/2023 10/08/21 11/07/21  Haskel Schroeder, PA-C      Allergies    Patient has no known allergies.    Review of Systems   Review of Systems  Cardiovascular:  Positive for chest pain.    Physical Exam Updated Vital Signs BP 112/75   Pulse 68   Temp 98 F (36.7 C) (Oral)   Resp 14   SpO2 100%  Physical Exam Vitals and nursing note reviewed.  Constitutional:      General: She is not in acute distress.    Appearance: She is well-developed.     Comments: Appears uncomfortable  HENT:     Head: Normocephalic and atraumatic.  Eyes:     Pupils: Pupils are equal, round, and reactive to light.  Cardiovascular:     Rate and Rhythm: Normal rate and regular rhythm.     Pulses: Normal pulses.     Heart sounds: Normal heart sounds. No murmur heard.    No friction rub.  Pulmonary:     Effort: Pulmonary effort is normal.     Breath sounds: Normal breath sounds. No wheezing or rales.  Chest:  Chest wall: No tenderness.  Abdominal:     General: Bowel sounds are normal. There is no distension.     Palpations: Abdomen is soft.     Tenderness: There is abdominal tenderness in the right lower quadrant, suprapubic area and left lower quadrant. There is no guarding or rebound.     Comments: No epigastric tenderness  Musculoskeletal:        General: Tenderness present. Normal range of motion.     Right lower leg: No edema.     Left lower leg: No edema.     Comments: Right thigh tenderness. No edema  Skin:    General: Skin is warm and dry.     Findings: No rash.  Neurological:     Mental Status: She is alert and oriented to person, place, and time. Mental status is at baseline.     Cranial Nerves: No cranial nerve deficit.  Psychiatric:        Behavior: Behavior  normal.     ED Results / Procedures / Treatments   Labs (all labs ordered are listed, but only abnormal results are displayed) Labs Reviewed  CBC WITH DIFFERENTIAL/PLATELET - Abnormal; Notable for the following components:      Result Value   Hemoglobin 8.5 (*)    HCT 30.1 (*)    MCV 67.5 (*)    MCH 19.1 (*)    MCHC 28.2 (*)    RDW 21.3 (*)    Platelets 433 (*)    All other components within normal limits  COMPREHENSIVE METABOLIC PANEL - Abnormal; Notable for the following components:   Potassium 3.0 (*)    Glucose, Bld 115 (*)    All other components within normal limits  D-DIMER, QUANTITATIVE  I-STAT BETA HCG BLOOD, ED ([facility], [facility], [facility] ONLY)  TROPONIN I (HIGH SENSITIVITY)  TROPONIN I (HIGH SENSITIVITY)    EKG EKG Interpretation  Date/Time:  Monday March 23 2023 08:49:10 EDT Ventricular Rate:  95 PR Interval:  149 QRS Duration: 83 QT Interval:  359 QTC Calculation: 452 R Axis:   60 Text Interpretation: Sinus rhythm Normal ECG Confirmed by Gwyneth Sprout ([id number]) on 03/23/2023 8:59:52 AM  Radiology DG Chest Port 1 View  Result Date: 03/23/2023 CLINICAL DATA:  Shortness of breath and chest pain. EXAM: PORTABLE CHEST 1 VIEW COMPARISON:  Chest radiograph 02/03/2018 FINDINGS: The cardiomediastinal contours are within normal limits. The lungs are clear. No pneumothorax or pleural effusion. No acute finding in the visualized skeleton. IMPRESSION: No acute cardiopulmonary process. Electronically Signed   By: Emmaline Kluver M.D.   On: 03/23/2023 09:33    Procedures Procedures    Medications Ordered in ED Medications  morphine (PF) 4 MG/ML injection 4 mg (4 mg Intravenous Given 03/23/23 0912)  ondansetron (ZOFRAN) injection 4 mg (4 mg Intravenous Given 03/23/23 0911)  potassium chloride SA (KLOR-CON M) CR tablet 40 mEq (40 mEq Oral Given 03/23/23 1218)  ketorolac (TORADOL) 15 MG/ML injection 15 mg (15 mg Intravenous Given 03/23/23 1219)    ED Course/ Medical Decision  Making/ A&P                             Medical Decision Making Amount and/or Complexity of Data Reviewed Labs: ordered. Decision-making details documented in ED Course. Radiology: ordered and independent interpretation performed. Decision-making details documented in ED Course. ECG/medicine tests: ordered and independent interpretation performed. Decision-making details documented in ED Course.  Risk Prescription drug management.  Pt with multiple medical problems and comorbidities and presenting today with a complaint that caries a high risk for morbidity and mortality.  Here today with complaint of chest pain.  Heart score of 2.  Concern for possible hypokalemia as the cause of her chest pain, PE but she is low risk so a D-dimer was sent, ACS, dissection, GI source, anemia.  Low suspicion for pneumonia or pneumothorax.  I independently interpreted patient's EKG which is normal today and similar to her prior EKGs.  Patient given pain control.  Labs and imaging are pending.  2:01 PM I independently interpreted patient's labs and troponin x 2 are negative, hCG is negative, CBC with anemia with a hemoglobin of 8.5 which is slightly improved from hemoglobin in the 7 range a few weeks ago, CMP with hypokalemia with potassium of 3.0 but otherwise normal and D-dimer is negative.  I have independently visualized and interpreted pt's images today.  Chest x-ray within normal limits today.  Patient improved after above therapy.  She was given oral potassium replacement as well and Toradol for her abdominal cramping.  At this time feel that she is stable for discharge home.  Will start iron therapy and she was given instructions on how to increase potassium in her diet and continue her oral potassium pills.          Final Clinical Impression(s) / ED Diagnoses Final diagnoses:  Nonspecific chest pain  Hypokalemia  Iron deficiency anemia due to chronic blood loss    Rx / DC Orders ED  Discharge Orders          Ordered    ferrous sulfate 325 (65 FE) MG tablet  Daily        03/23/23 1400              Gwyneth Sprout, MD 03/23/23 1401

## 2023-03-23 NOTE — ED Notes (Signed)
Wheeled patient to the bathroom  

## 2023-03-23 NOTE — Discharge Instructions (Addendum)
Your potassium was low today and in addition to taking the potassium pills you have at home try to increase potassium in your diet.  Also you are prescribed iron because your blood counts are low and it will help improve your blood counts.  There is no sign of heart attack today or blood clots.  However if the pain gets worse and you start having episodes of passing out you should return to the emergency room.

## 2023-03-23 NOTE — ED Triage Notes (Signed)
Pt reports central chest pain that started at work this morning.

## 2023-05-06 ENCOUNTER — Encounter (HOSPITAL_COMMUNITY): Payer: Self-pay

## 2023-05-06 ENCOUNTER — Emergency Department (HOSPITAL_COMMUNITY)
Admission: EM | Admit: 2023-05-06 | Discharge: 2023-05-06 | Disposition: A | Discharge status: Home / Self Care | Payer: Medicaid Other | Attending: Emergency Medicine | Admitting: Emergency Medicine

## 2023-05-06 ENCOUNTER — Emergency Department (HOSPITAL_BASED_OUTPATIENT_CLINIC_OR_DEPARTMENT_OTHER): Payer: Medicaid Other

## 2023-05-06 ENCOUNTER — Other Ambulatory Visit: Payer: Self-pay

## 2023-05-06 DIAGNOSIS — Z79899 Other long term (current) drug therapy: Secondary | ICD-10-CM | POA: Insufficient documentation

## 2023-05-06 DIAGNOSIS — M79604 Pain in right leg: Secondary | ICD-10-CM | POA: Diagnosis present

## 2023-05-06 DIAGNOSIS — R609 Edema, unspecified: Secondary | ICD-10-CM | POA: Diagnosis not present

## 2023-05-06 DIAGNOSIS — I10 Essential (primary) hypertension: Secondary | ICD-10-CM | POA: Insufficient documentation

## 2023-05-06 DIAGNOSIS — M549 Dorsalgia, unspecified: Secondary | ICD-10-CM

## 2023-05-06 DIAGNOSIS — R600 Localized edema: Secondary | ICD-10-CM | POA: Diagnosis not present

## 2023-05-06 DIAGNOSIS — D649 Anemia, unspecified: Secondary | ICD-10-CM | POA: Diagnosis not present

## 2023-05-06 DIAGNOSIS — M546 Pain in thoracic spine: Secondary | ICD-10-CM | POA: Insufficient documentation

## 2023-05-06 LAB — CBC WITH DIFFERENTIAL/PLATELET
Abs Immature Granulocytes: 0.02 10*3/uL (ref 0.00–0.07)
Basophils Absolute: 0.1 10*3/uL (ref 0.0–0.1)
Basophils Relative: 1 %
Eosinophils Absolute: 0.2 10*3/uL (ref 0.0–0.5)
Eosinophils Relative: 4 %
HCT: 29.5 % — ABNORMAL LOW (ref 36.0–46.0)
Hemoglobin: 8.4 g/dL — ABNORMAL LOW (ref 12.0–15.0)
Immature Granulocytes: 0 %
Lymphocytes Relative: 32 %
Lymphs Abs: 1.9 10*3/uL (ref 0.7–4.0)
MCH: 19.1 pg — ABNORMAL LOW (ref 26.0–34.0)
MCHC: 28.5 g/dL — ABNORMAL LOW (ref 30.0–36.0)
MCV: 67.2 fL — ABNORMAL LOW (ref 80.0–100.0)
Monocytes Absolute: 0.4 10*3/uL (ref 0.1–1.0)
Monocytes Relative: 7 %
Neutro Abs: 3.5 10*3/uL (ref 1.7–7.7)
Neutrophils Relative %: 56 %
Platelets: 292 10*3/uL (ref 150–400)
RBC: 4.39 MIL/uL (ref 3.87–5.11)
RDW: 19.7 % — ABNORMAL HIGH (ref 11.5–15.5)
WBC: 6.1 10*3/uL (ref 4.0–10.5)
nRBC: 0 % (ref 0.0–0.2)

## 2023-05-06 LAB — BASIC METABOLIC PANEL
Anion gap: 9 (ref 5–15)
BUN: 5 mg/dL — ABNORMAL LOW (ref 6–20)
CO2: 21 mmol/L — ABNORMAL LOW (ref 22–32)
Calcium: 8.7 mg/dL — ABNORMAL LOW (ref 8.9–10.3)
Chloride: 106 mmol/L (ref 98–111)
Creatinine, Ser: 0.59 mg/dL (ref 0.44–1.00)
GFR, Estimated: >60 mL/min (ref >60)
Glucose, Bld: 102 mg/dL — ABNORMAL HIGH (ref 70–99)
Potassium: 3.3 mmol/L — ABNORMAL LOW (ref 3.5–5.1)
Sodium: 136 mmol/L (ref 135–145)

## 2023-05-06 MED ORDER — POTASSIUM CHLORIDE CRYS ER 20 MEQ PO TBCR: 20.0000 meq | EXTENDED_RELEASE_TABLET | Freq: Once | ORAL | Status: DC

## 2023-05-06 MED ORDER — METHOCARBAMOL 500 MG PO TABS: 500.0000 mg | ORAL_TABLET | Freq: Two times a day (BID) | ORAL | 0 refills | Status: DC

## 2023-05-06 MED ORDER — LIDOCAINE 5 % EX PTCH
2.0000 | MEDICATED_PATCH | CUTANEOUS | Status: DC
  Administered 2023-05-06: 2 via TRANSDERMAL
  Filled 2023-05-06: qty 2

## 2023-05-06 MED ORDER — MORPHINE SULFATE (PF) 4 MG/ML IV SOLN
4.0000 mg | Freq: Once | INTRAVENOUS | Status: AC
  Administered 2023-05-06: 4 mg via INTRAVENOUS
  Filled 2023-05-06: qty 1

## 2023-05-06 MED ORDER — METHOCARBAMOL 500 MG PO TABS
500.0000 mg | ORAL_TABLET | Freq: Once | ORAL | Status: AC
  Administered 2023-05-06: 500 mg via ORAL
  Filled 2023-05-06: qty 1

## 2023-05-06 MED ORDER — HYDROCODONE-ACETAMINOPHEN 5-325 MG PO TABS: 1.0000 | ORAL_TABLET | Freq: Four times a day (QID) | ORAL | 0 refills | Status: DC | PRN

## 2023-05-06 MED ORDER — NAPROXEN 500 MG PO TABS: 500.0000 mg | ORAL_TABLET | Freq: Two times a day (BID) | ORAL | 0 refills | Status: DC

## 2023-05-06 NOTE — ED Notes (Signed)
Pt able to ambulate a couple of steps only due to severe pain in her R lower leg. Provider made aware.

## 2023-05-06 NOTE — ED Triage Notes (Signed)
Patient reports pain at back of right knee radiating down to right calf and right ankle onset yesterday , she adds pain at right upper back , denies injury /ambulatory .

## 2023-05-06 NOTE — Discharge Instructions (Addendum)
It was a pleasure taking care of you today.  As discussed, back pain can take up to 6 weeks to completely resolve.  I am sending you home with a muscle relaxer and pain medication.  Hydrocodone and muscle relaxer can cause drowsiness so do not drive or operate machinery while on the medication.  I have included the number of [facility].  Call to schedule an appointment to establish care.  Return to the ER for new or worsening symptoms.  Your hemoglobin was low. Please have it rechecked within the next week.

## 2023-05-06 NOTE — Progress Notes (Signed)
Right lower extremity venous study completed.   Preliminary results relayed to MD and RN.  Please see CV Procedures for preliminary results.  Dushaun Okey, RVT  8:55 AM 05/06/23

## 2023-05-06 NOTE — ED Provider Notes (Signed)
[place] [facility] DEPARTMENT AT [facility] Provider Note   CSN: [id number] Arrival date & time: 05/06/23  0544     History  Chief Complaint  Patient presents with   Leg Pain   Back Pain    Brittany Graves is a 37 y.o. female with a past medical history significant for hypertension who presents to the ED due to right lower extremity pain and edema that has worsened over the past 2 weeks.  No history of blood clots or recent surgeries, recent long immobilizations.  Patient states pain started in her ankle and has worked its way up her right lower extremity.  Patient also notes that yesterday she began to develop pain on her right upper back and radiates down her entire right side of her body.  Denies any pain in her right upper extremity.  No numbness or tingling.  Denies weakness.  Denies saddle anesthesia, bowel/bladder incontinence.  Denies any neck pain.  Patient states she is unsure whether or not it is caused by how she is sitting due to her right lower extremity pain.  No fever or chills.  Denies chest pain and shortness of breath. She notes her RLE is slightly edematous compared to left.  History obtained from patient and past medical records. No interpreter used during encounter.       Home Medications Prior to Admission medications   Medication Sig Start Date End Date Taking? Authorizing Provider  HYDROcodone-acetaminophen (NORCO/VICODIN) 5-325 MG tablet Take 1 tablet by mouth every 6 (six) hours as needed. 05/06/23  Yes Levonne Carreras, Merla Riches, PA-C  methocarbamol (ROBAXIN) 500 MG tablet Take 1 tablet (500 mg total) by mouth 2 (two) times daily. 05/06/23  Yes Dewaun Kinzler C, PA-C  naproxen (NAPROSYN) 500 MG tablet Take 1 tablet (500 mg total) by mouth 2 (two) times daily. 05/06/23  Yes Tiffny Gemmer C, PA-C  amLODipine (NORVASC) 5 MG tablet Take 1 tablet (5 mg total) by mouth daily. Patient not taking: Reported on 03/23/2023 10/08/21 11/07/21  Haskel Schroeder,  PA-C  ferrous sulfate 325 (65 FE) MG tablet Take 1 tablet (325 mg total) by mouth daily. 03/23/23   Gwyneth Sprout, MD  hydrochlorothiazide (HYDRODIURIL) 25 MG tablet Take 1 tablet (25 mg total) by mouth daily. 03/03/23   Prosperi, Christian H, PA-C  potassium chloride (KLOR-CON) 10 MEQ tablet Take 2 tablets (20 mEq total) by mouth daily. Patient taking differently: Take 10 mEq by mouth daily. 03/03/23   Prosperi, Christian H, PA-C      Allergies    Patient has no known allergies.    Review of Systems   Review of Systems  Respiratory:  Negative for shortness of breath.   Cardiovascular:  Positive for leg swelling. Negative for chest pain.  Musculoskeletal:  Positive for arthralgias and back pain.    Physical Exam Updated Vital Signs BP 137/89 (BP Location: Right Arm)   Pulse 98   Temp 98.3 F (36.8 C) (Oral)   Resp 18   Ht 5\' 4"  (1.626 m)   Wt 106.6 kg   SpO2 100%   BMI 40.34 kg/m  Physical Exam Vitals and nursing note reviewed.  Constitutional:      General: She is not in acute distress.    Appearance: She is ill-appearing.     Comments: Tearful. Appears uncomfortable in bed.  HENT:     Head: Normocephalic.  Eyes:     Pupils: Pupils are equal, round, and reactive to light.  Neck:  Comments: Full ROM of neck. No cervical midline tenderness. Cardiovascular:     Rate and Rhythm: Normal rate and regular rhythm.     Pulses: Normal pulses.     Heart sounds: Normal heart sounds. No murmur heard.    No friction rub. No gallop.  Pulmonary:     Effort: Pulmonary effort is normal.     Breath sounds: Normal breath sounds.  Abdominal:     General: Abdomen is flat. There is no distension.     Palpations: Abdomen is soft.     Tenderness: There is no abdominal tenderness. There is no guarding or rebound.  Musculoskeletal:        General: Normal range of motion.     Cervical back: Neck supple.     Comments: Mild edema to RLE  Skin:    General: Skin is warm and dry.   Neurological:     General: No focal deficit present.     Mental Status: She is alert.  Psychiatric:        Mood and Affect: Mood normal.        Behavior: Behavior normal.     ED Results / Procedures / Treatments   Labs (all labs ordered are listed, but only abnormal results are displayed) Labs Reviewed  CBC WITH DIFFERENTIAL/PLATELET - Abnormal; Notable for the following components:      Result Value   Hemoglobin 8.4 (*)    HCT 29.5 (*)    MCV 67.2 (*)    MCH 19.1 (*)    MCHC 28.5 (*)    RDW 19.7 (*)    All other components within normal limits  BASIC METABOLIC PANEL - Abnormal; Notable for the following components:   Potassium 3.3 (*)    CO2 21 (*)    Glucose, Bld 102 (*)    BUN 5 (*)    Calcium 8.7 (*)    All other components within normal limits    EKG None  Radiology VAS [place] LOWER EXTREMITY VENOUS (DVT) (7a-7p)  Result Date: 05/06/2023  Lower Venous DVT Study Patient Name:  Brittany Graves  Date of Exam:   05/06/2023 Medical Rec #: [id number]        Accession #:    [id number] Date of Birth: March 01, 1986       Patient Gender: F Patient Age:   20 years Exam Location:  [facility] Procedure:      VAS [place] LOWER EXTREMITY VENOUS (DVT) Referring Phys: Claudette Stapler --------------------------------------------------------------------------------  Indications: Right back of knee pain that radiates down to calf and ankle x1day.  Limitations: Patient pain and inability to tolerate compressions. Comparison Study: No previous study. Performing Technologist: McKayla Maag RVT, [place]  Examination Guidelines: A complete evaluation includes B-mode imaging, spectral Doppler, color Doppler, and power Doppler as needed of all accessible portions of each vessel. Bilateral testing is considered an integral part of a complete examination. Limited examinations for reoccurring indications may be performed as noted. The reflux portion of the exam is performed with the patient in reverse  Trendelenburg.  +--------+---------------+---------+-----------+----------+--------------------+ RIGHT   CompressibilityPhasicitySpontaneityPropertiesThrombus Aging       +--------+---------------+---------+-----------+----------+--------------------+ CFV     Full           Yes      Yes                                       +--------+---------------+---------+-----------+----------+--------------------+ SFJ  Full                                                              +--------+---------------+---------+-----------+----------+--------------------+ FV Prox Full                                                              +--------+---------------+---------+-----------+----------+--------------------+ FV Mid  Full                                                              +--------+---------------+---------+-----------+----------+--------------------+ FV                     Yes      Yes                  Patent by color      Distal                                               doppler              +--------+---------------+---------+-----------+----------+--------------------+ PFV     Full                                                              +--------+---------------+---------+-----------+----------+--------------------+ POP     Full           Yes      Yes                                       +--------+---------------+---------+-----------+----------+--------------------+ PTV     Full                                                              +--------+---------------+---------+-----------+----------+--------------------+ PERO    Full                                                              +--------+---------------+---------+-----------+----------+--------------------+   +----+---------------+---------+-----------+----------+--------------+ LEFTCompressibilityPhasicitySpontaneityPropertiesThrombus Aging  +----+---------------+---------+-----------+----------+--------------+ CFV Full           Yes      Yes                                 +----+---------------+---------+-----------+----------+--------------+  SFJ Full                                                        +----+---------------+---------+-----------+----------+--------------+    Summary: RIGHT: - There is no evidence of deep vein thrombosis in the lower extremity. However, portions of this examination were limited- see technologist comments above.  - No cystic structure found in the popliteal fossa.  LEFT: - No evidence of common femoral vein obstruction.  *See table(s) above for measurements and observations.    Preliminary     Procedures Procedures    Medications Ordered in ED Medications  lidocaine (LIDODERM) 5 % 2 patch (2 patches Transdermal Patch Applied 05/06/23 0736)  potassium chloride SA (KLOR-CON M) CR tablet 20 mEq (has no administration in time range)  morphine (PF) 4 MG/ML injection 4 mg (4 mg Intravenous Given 05/06/23 0755)  methocarbamol (ROBAXIN) tablet 500 mg (500 mg Oral Given 05/06/23 1610)    ED Course/ Medical Decision Making/ A&P                             Medical Decision Making Amount and/or Complexity of Data Reviewed Labs: ordered. Decision-making details documented in ED Course. Radiology: ordered and independent interpretation performed. Decision-making details documented in ED Course.  Risk Prescription drug management.   This patient presents to the ED for concern of back pain, this involves an extensive number of treatment options, and is a complaint that carries with it a high risk of complications and morbidity.  The differential diagnosis includes radiculopathy, infection, cauda equina, DVT, aortic dissection, etc  37 year old female presents to the ED due to right lower extremity pain and edema for the past 2 weeks.  Patient states yesterday she developed pain in the right  side of her upper back that radiates down her entire right side of her body.  No recent injury.  Denies saddle anesthesia, bowel/bladder incontinence, lower extremity numbness/tingling, lower extremity weakness.  Patient states pain does not go in her right upper extremity.  No chest pain or shortness of breath.  No history of blood clots.  Upon arrival patient mildly tachycardic at 104.  Patient tearful during initial exam and appears extremely uncomfortable in bed.  Mild edema to right lower extremity.  Full range of motion of neck.  No cervical midline tenderness.  Equal grip strength.  Low suspicion for central cord compression or cauda equina.  Pain medication given.  Ultrasound to rule out DVT given lower extremity edema.   8:51 AM reassessed patient at bedside.  Patient resting comfortably in bed.  Admits to significant improvement in pain after pain medication and muscle relaxer.  Awaiting ultrasound to rule out DVT of right lower extremity.  9:40 AM reassessed patient at bedside. Patient admits to improvement in pain.  Denies saddle anesthesia, bowel/bladder incontinence, lower extremity numbness/tingling, lower extremity weakness.  Patient able to ambulate however, admits to significant pain in right lower extremity.  No weakness on exam.  Low suspicion for cauda equina or central cord compression.  Pain is all right-sided in the musculature.  No midline tenderness.  Suspect right lower extremity pain secondary to lumbar radiculopathy. Unclear why she is having upper right sided back pain, likely muscular in nature given reproducible nature on exam. No chest pain  or shortness of breath. Low suspicion for PE or aortic dissection. EKG NSR.  BMP significant for hypokalemia at 3.3. Potassium repleted.  Normal creatinine.  CBC with anemia with hemoglobin 8.4 which appears to be around patient's baseline.  Patient has a history of iron deficiency anemia.  Patient currently asymptomatic.   Will treat  patient symptomatically at this time with pain medication and muscle relaxers.  Discussed red flags for when patient should return to the ED.  Will hold off on imaging at this time however, if symptoms worsen may need to consider MRI.  [facility] wellness number given to patient at discharge and advised to call to establish care.  No signs of infection of right lower extremity.  Low suspicion for cellulitis.  Right lower extremity neurovascularly intact.  Low suspicion for compartment syndrome. No injury or midline tenderness to suggest bony fracture. No infectious symptoms to suggest infectious etiology. Suspect symptoms related to lumbar radiculopathy. Strict ED precautions discussed with patient. Patient states understanding and agrees to plan. Patient discharged home in no acute distress and stable vitals  No PCP        Final Clinical Impression(s) / ED Diagnoses Final diagnoses:  Right leg pain  Acute right-sided back pain, unspecified back location    Rx / DC Orders ED Discharge Orders          Ordered    methocarbamol (ROBAXIN) 500 MG tablet  2 times daily        05/06/23 0947    HYDROcodone-acetaminophen (NORCO/VICODIN) 5-325 MG tablet  Every 6 hours PRN        05/06/23 0947    naproxen (NAPROSYN) 500 MG tablet  2 times daily        05/06/23 0947              Mannie Stabile, PA-C 05/06/23 1610    Margarita Grizzle, MD 05/07/23 [phone]

## 2023-06-18 ENCOUNTER — Emergency Department (HOSPITAL_COMMUNITY): Payer: Medicaid Other

## 2023-06-18 ENCOUNTER — Observation Stay (HOSPITAL_COMMUNITY)
Admission: EM | Admit: 2023-06-18 | Discharge: 2023-06-19 | Disposition: A | Discharge status: Home / Self Care | Payer: Medicaid Other | Attending: Family Medicine | Admitting: Family Medicine

## 2023-06-18 ENCOUNTER — Other Ambulatory Visit: Payer: Self-pay

## 2023-06-18 ENCOUNTER — Encounter (HOSPITAL_COMMUNITY): Payer: Self-pay | Admitting: Emergency Medicine

## 2023-06-18 DIAGNOSIS — F17200 Nicotine dependence, unspecified, uncomplicated: Secondary | ICD-10-CM | POA: Insufficient documentation

## 2023-06-18 DIAGNOSIS — D649 Anemia, unspecified: Secondary | ICD-10-CM | POA: Diagnosis not present

## 2023-06-18 DIAGNOSIS — I10 Essential (primary) hypertension: Secondary | ICD-10-CM | POA: Insufficient documentation

## 2023-06-18 DIAGNOSIS — R531 Weakness: Secondary | ICD-10-CM | POA: Diagnosis not present

## 2023-06-18 DIAGNOSIS — I639 Cerebral infarction, unspecified: Secondary | ICD-10-CM | POA: Insufficient documentation

## 2023-06-18 DIAGNOSIS — Z758 Other problems related to medical facilities and other health care: Secondary | ICD-10-CM | POA: Insufficient documentation

## 2023-06-18 DIAGNOSIS — D509 Iron deficiency anemia, unspecified: Secondary | ICD-10-CM | POA: Insufficient documentation

## 2023-06-18 DIAGNOSIS — Z79899 Other long term (current) drug therapy: Secondary | ICD-10-CM | POA: Insufficient documentation

## 2023-06-18 DIAGNOSIS — R519 Headache, unspecified: Secondary | ICD-10-CM | POA: Diagnosis present

## 2023-06-18 DIAGNOSIS — Z716 Tobacco abuse counseling: Secondary | ICD-10-CM | POA: Insufficient documentation

## 2023-06-18 LAB — DIFFERENTIAL
Abs Immature Granulocytes: 0.01 10*3/uL (ref 0.00–0.07)
Basophils Absolute: 0.1 10*3/uL (ref 0.0–0.1)
Basophils Relative: 1 %
Eosinophils Absolute: 0.2 10*3/uL (ref 0.0–0.5)
Eosinophils Relative: 3 %
Immature Granulocytes: 0 %
Lymphocytes Relative: 40 %
Lymphs Abs: 2.8 10*3/uL (ref 0.7–4.0)
Monocytes Absolute: 0.5 10*3/uL (ref 0.1–1.0)
Monocytes Relative: 7 %
Neutro Abs: 3.4 10*3/uL (ref 1.7–7.7)
Neutrophils Relative %: 49 %

## 2023-06-18 LAB — CBC
HCT: 29.9 % — ABNORMAL LOW (ref 36.0–46.0)
Hemoglobin: 8.6 g/dL — ABNORMAL LOW (ref 12.0–15.0)
MCH: 19.2 pg — ABNORMAL LOW (ref 26.0–34.0)
MCHC: 28.8 g/dL — ABNORMAL LOW (ref 30.0–36.0)
MCV: 66.9 fL — ABNORMAL LOW (ref 80.0–100.0)
Platelets: 381 10*3/uL (ref 150–400)
RBC: 4.47 MIL/uL (ref 3.87–5.11)
RDW: 18 % — ABNORMAL HIGH (ref 11.5–15.5)
WBC: 6.9 10*3/uL (ref 4.0–10.5)
nRBC: 0 % (ref 0.0–0.2)

## 2023-06-18 LAB — COMPREHENSIVE METABOLIC PANEL
ALT: 16 U/L (ref 0–44)
AST: 17 U/L (ref 15–41)
Albumin: 3.7 g/dL (ref 3.5–5.0)
Alkaline Phosphatase: 80 U/L (ref 38–126)
Anion gap: 10 (ref 5–15)
BUN: 5 mg/dL — ABNORMAL LOW (ref 6–20)
CO2: 23 mmol/L (ref 22–32)
Calcium: 9.1 mg/dL (ref 8.9–10.3)
Chloride: 105 mmol/L (ref 98–111)
Creatinine, Ser: 0.73 mg/dL (ref 0.44–1.00)
GFR, Estimated: >60 mL/min (ref >60)
Glucose, Bld: 100 mg/dL — ABNORMAL HIGH (ref 70–99)
Potassium: 3.2 mmol/L — ABNORMAL LOW (ref 3.5–5.1)
Sodium: 138 mmol/L (ref 135–145)
Total Bilirubin: 0.5 mg/dL (ref 0.3–1.2)
Total Protein: 7.1 g/dL (ref 6.5–8.1)

## 2023-06-18 LAB — APTT: aPTT: 24 seconds (ref 24–36)

## 2023-06-18 LAB — I-STAT CHEM 8, ED
BUN: 3 mg/dL — ABNORMAL LOW (ref 6–20)
Calcium, Ion: 1.12 mmol/L — ABNORMAL LOW (ref 1.15–1.40)
Chloride: 104 mmol/L (ref 98–111)
Creatinine, Ser: 0.6 mg/dL (ref 0.44–1.00)
Glucose, Bld: 95 mg/dL (ref 70–99)
HCT: 30 % — ABNORMAL LOW (ref 36.0–46.0)
Hemoglobin: 10.2 g/dL — ABNORMAL LOW (ref 12.0–15.0)
Potassium: 3.3 mmol/L — ABNORMAL LOW (ref 3.5–5.1)
Sodium: 139 mmol/L (ref 135–145)
TCO2: 22 mmol/L (ref 22–32)

## 2023-06-18 LAB — PROTIME-INR
INR: 1.1 (ref 0.8–1.2)
Prothrombin Time: 14.8 seconds (ref 11.4–15.2)

## 2023-06-18 LAB — ETHANOL: Alcohol, Ethyl (B): <10 mg/dL (ref <10)

## 2023-06-18 LAB — CBG MONITORING, ED: Glucose-Capillary: 99 mg/dL (ref 70–99)

## 2023-06-18 LAB — HCG, SERUM, QUALITATIVE: Preg, Serum: NEGATIVE

## 2023-06-18 MED ORDER — ACETAMINOPHEN 650 MG RE SUPP: 650.0000 mg | Freq: Four times a day (QID) | RECTAL | Status: DC | PRN

## 2023-06-18 MED ORDER — CLEVIDIPINE BUTYRATE 0.5 MG/ML IV EMUL
INTRAVENOUS | Status: AC
  Filled 2023-06-18: qty 50

## 2023-06-18 MED ORDER — LORAZEPAM 2 MG/ML IJ SOLN
1.0000 mg | Freq: Once | INTRAMUSCULAR | Status: AC | PRN
  Administered 2023-06-18: 1 mg via INTRAVENOUS
  Filled 2023-06-18: qty 1

## 2023-06-18 MED ORDER — NICOTINE 14 MG/24HR TD PT24
14.0000 mg | MEDICATED_PATCH | Freq: Every day | TRANSDERMAL | Status: DC
  Administered 2023-06-19 (×2): 14 mg via TRANSDERMAL
  Filled 2023-06-18 (×2): qty 1

## 2023-06-18 MED ORDER — ACETAMINOPHEN 325 MG PO TABS: 650.0000 mg | ORAL_TABLET | Freq: Four times a day (QID) | ORAL | Status: DC | PRN

## 2023-06-18 MED ORDER — IOHEXOL 350 MG/ML SOLN
75.0000 mL | Freq: Once | INTRAVENOUS | Status: AC | PRN
  Administered 2023-06-18: 75 mL via INTRAVENOUS

## 2023-06-18 MED ORDER — PROCHLORPERAZINE EDISYLATE 10 MG/2ML IJ SOLN
10.0000 mg | Freq: Once | INTRAMUSCULAR | Status: AC
  Administered 2023-06-18: 10 mg via INTRAVENOUS
  Filled 2023-06-18: qty 2

## 2023-06-18 NOTE — Assessment & Plan Note (Signed)
Suspected stroke, but not apparent on MRI. LNK possibly 1600 today (7/11), though patient timeline is unclear. As such, not a candidate for TNK. Also considering complex migraine vs sinusitis vs dehydration. - Admit to FMTS, attending Dr. McDiarmid - Progressive, Vital signs per floor - Regular diet  - PT/OT to treat - VTE prophylaxis: SCDs - AM CBC/BMP  - Fall precautions - UDS - permissive HTN, spot dose if BP >220/110

## 2023-06-18 NOTE — Assessment & Plan Note (Addendum)
Patient has been out of her BP medication for at least 2 weeks. - monitor BP - permissive HTN, spot dose if BP >220/110

## 2023-06-18 NOTE — Hospital Course (Addendum)
Brittany Graves is a 37 y.o.female with a history of HTN, tobacco use, hidradenitis suppurativa who was admitted to the [facility] Medicine Teaching Service at [facility] for right- sided weakness concerning for TIA. Her hospital course is detailed below:  Right-sided weakness Code Stroke called in ED for right-sided numbness/weakness in upper and lower extremities around 4 hours prior to arrival. CT head suggestive of possible inferior right cerebellar nonhemorrhagic infarct. MRI, although limited, negative for acute or subacute infarct. Neurology consulted and recommeded to start ASA 81 mg daily, however we held off for the time being due to her severe iron deficiency anemia and negative stroke workup. Risk stratification labs obtained, TSH was normal and lipid panel did not show HLD. A1C pending at time of discharge.   Hypertension Patient reports getting prescriptions for BP medications through the ED when she comes in for acute complaints as she does not have a PCP. BP was moderately elevated at admission, but patient was allowed permissive HTN due to stroke-like symptoms. Patient was not restarted on home dose of hydrochlorothiazide due to starting verapamil for migraine prophylaxis.   Headache  Complex Migraines Patient reports having a headache for the past two days that she describes as pulsatile, unilateral (R), frontal, associated with nausea occasionally but not currently at this time, and disabling due to having right sided weakness. Her symptoms gave [place] concern for complex vs. hemiplegic migraine due to her normal CNS imaging and total R sided weakness. Started patient on prophylactic dose of Verapamil 120 mg BID.  Tobacco Use Smokes 1 PPD, unclear pack-year history but extensive, per patient. Treated with nicotine patch while inpatient.   PCP Follow-up Recommendations: Labs still pending - A1c Recheck blood pressure outpatient. DC hydrochlorothiazide at discharge.  Neurology  recommended ASA 81 daily but this was held in the hospital in the setting of anemia, consider starting on follow up Consider abnormal uterine bleeding causing her severe iron deficiency anemia and possible IV iron infusions. As verapamil can increase constipation, consider discussing bowel regimen Consider XR of cervical and lumbar spine outpatient to assess for degenerative changes to rule out possible radiculopathy causing her weakness. Discuss smoking cessation options, if patient is interested.

## 2023-06-18 NOTE — ED Notes (Signed)
ED TO INPATIENT HANDOFF REPORT  ED Nurse Name and Phone #: Rodney Booze [phone]  S Name/Age/Gender Brittany Graves 37 y.o. female Room/Bed: 008C/008C  Code Status   Code Status: Full Code  Home/SNF/Other Home Patient oriented to: self, place, time, and situation Is this baseline? Yes   Triage Complete: Triage complete  Chief Complaint Weakness [R53.1]  Triage Note Patient arrives ambulatory by POV c/o headache x 2 days. States while walking up the steps today she began feeling short of breath and light headed. Reports having stiffness to right side of face and right leg. Also having light sensitivity and feeling hot. Patient adds she is in the process of finding a PCP but she is out of her potassium and BP meds.    Allergies No Known Allergies  Level of Care/Admitting Diagnosis ED Disposition     ED Disposition  Admit   Condition  --   Comment  Hospital Area: [facility] [[id number]]  Level of Care: Progressive [102]  Admit to Progressive based on following criteria: NEUROLOGICAL AND NEUROSURGICAL complex patients with significant risk of instability, who do not meet ICU criteria, yet require close observation or frequent assessment (< / = every 2 - 4 hours) with medical / nursing intervention.  May place patient in observation at [facility] or [facility] if equivalent level of care is available:: No  Covid Evaluation: Asymptomatic - no recent exposure (last 10 days) testing not required  Diagnosis: Weakness [241835]  Admitting Physician: Cyndia Skeeters [[id number]]  Attending Physician: Perley Jain, TODD D [[id number]]          B Medical/Surgery History Past Medical History:  Diagnosis Date   Hypertension    Past Surgical History:  Procedure Laterality Date   TUBAL LIGATION       A IV Location/Drains/Wounds Patient Lines/Drains/Airways Status     Active Line/Drains/Airways     Name Placement date Placement time Site Days   Peripheral IV 06/18/23 20  G Left Antecubital 06/18/23  1720  Antecubital  less than 1            Intake/Output Last 24 hours No intake or output data in the 24 hours ending 06/18/23 2202  Labs/Imaging Results for orders placed or performed during the hospital encounter of 06/18/23 (from the past 48 hour(s))  Ethanol     Status: None   Collection Time: 06/18/23  4:36 PM  Result Value Ref Range   Alcohol, Ethyl (B) <10 <10 mg/dL    Comment: (NOTE) Lowest detectable limit for serum alcohol is 10 mg/dL.  For medical purposes only. Performed at [facility], [address] [place], [place] [address]   Protime-INR     Status: None   Collection Time: 06/18/23  4:37 PM  Result Value Ref Range   Prothrombin Time 14.8 11.4 - 15.2 seconds   INR 1.1 0.8 - 1.2    Comment: (NOTE) INR goal varies based on device and disease states. Performed at [facility], [address] [place], [place] [address]   APTT     Status: None   Collection Time: 06/18/23  4:37 PM  Result Value Ref Range   aPTT 24 24 - 36 seconds    Comment: Performed at [facility], [address] [place], [place] [address]  CBC     Status: Abnormal   Collection Time: 06/18/23  4:37 PM  Result Value Ref Range   WBC 6.9 4.0 - 10.5 K/uL  RBC 4.47 3.87 - 5.11 MIL/uL   Hemoglobin 8.6 (L) 12.0 - 15.0 g/dL    Comment: Reticulocyte Hemoglobin testing may be clinically indicated, consider ordering this additional test [id number]    HCT 29.9 (L) 36.0 - 46.0 %   MCV 66.9 (L) 80.0 - 100.0 fL   MCH 19.2 (L) 26.0 - 34.0 pg   MCHC 28.8 (L) 30.0 - 36.0 g/dL   RDW 69.6 (H) 29.5 - 28.4 %   Platelets 381 150 - 400 K/uL    Comment: REPEATED TO VERIFY   nRBC 0.0 0.0 - 0.2 %    Comment: Performed at [facility], [address] [place], [place] [address]  Differential     Status: None   Collection Time: 06/18/23  4:37 PM  Result Value Ref Range   Neutrophils Relative % 49 %   Neutro Abs 3.4 1.7 - 7.7 K/uL   Lymphocytes  Relative 40 %   Lymphs Abs 2.8 0.7 - 4.0 K/uL   Monocytes Relative 7 %   Monocytes Absolute 0.5 0.1 - 1.0 K/uL   Eosinophils Relative 3 %   Eosinophils Absolute 0.2 0.0 - 0.5 K/uL   Basophils Relative 1 %   Basophils Absolute 0.1 0.0 - 0.1 K/uL   Immature Granulocytes 0 %   Abs Immature Granulocytes 0.01 0.00 - 0.07 K/uL    Comment: Performed at [facility], [address], [place], [place] [address]  Comprehensive metabolic panel     Status: Abnormal   Collection Time: 06/18/23  4:37 PM  Result Value Ref Range   Sodium 138 135 - 145 mmol/L   Potassium 3.2 (L) 3.5 - 5.1 mmol/L   Chloride 105 98 - 111 mmol/L   CO2 23 22 - 32 mmol/L   Glucose, Bld 100 (H) 70 - 99 mg/dL    Comment: Glucose reference range applies only to samples taken after fasting for at least 8 hours.   BUN 5 (L) 6 - 20 mg/dL   Creatinine, Ser 2.53 0.44 - 1.00 mg/dL   Calcium 9.1 8.9 - 66.4 mg/dL   Total Protein 7.1 6.5 - 8.1 g/dL   Albumin 3.7 3.5 - 5.0 g/dL   AST 17 15 - 41 U/L   ALT 16 0 - 44 U/L   Alkaline Phosphatase 80 38 - 126 U/L   Total Bilirubin 0.5 0.3 - 1.2 mg/dL   GFR, Estimated >40 >34 mL/min    Comment: (NOTE) Calculated using the CKD-EPI Creatinine Equation (2021)    Anion gap 10 5 - 15    Comment: Performed at [facility], [address] [place], [place] [address]  hCG, serum, qualitative     Status: None   Collection Time: 06/18/23  4:37 PM  Result Value Ref Range   Preg, Serum NEGATIVE NEGATIVE    Comment:        THE SENSITIVITY OF THIS METHODOLOGY IS >10 mIU/mL. Performed at [facility], [address], [place], [place] [address]   CBG monitoring, ED     Status: None   Collection Time: 06/18/23  4:39 PM  Result Value Ref Range   Glucose-Capillary 99 70 - 99 mg/dL    Comment: Glucose reference range applies only to samples taken after fasting for at least 8 hours.  I-stat chem 8, ED     Status: Abnormal   Collection Time: 06/18/23  4:42 PM  Result Value Ref  Range   Sodium 139 135 - 145  mmol/L   Potassium 3.3 (L) 3.5 - 5.1 mmol/L   Chloride 104 98 - 111 mmol/L   BUN 3 (L) 6 - 20 mg/dL   Creatinine, Ser 1.61 0.44 - 1.00 mg/dL   Glucose, Bld 95 70 - 99 mg/dL    Comment: Glucose reference range applies only to samples taken after fasting for at least 8 hours.   Calcium, Ion 1.12 (L) 1.15 - 1.40 mmol/L   TCO2 22 22 - 32 mmol/L   Hemoglobin 10.2 (L) 12.0 - 15.0 g/dL   HCT 09.6 (L) 04.5 - 40.9 %   MR Brain Wo Contrast (neuro protocol)  Result Date: 06/18/2023 CLINICAL DATA:  Stroke suspected, difficulty with right arm and leg EXAM: MRI HEAD WITHOUT CONTRAST TECHNIQUE: Multiplanar, multiecho pulse sequences of the brain and surrounding structures were obtained without intravenous contrast. COMPARISON:  No prior head MRI available, correlation is made with CT head 06/18/2023 FINDINGS: Evaluation is limited as the patient was not able to complete the exam. Only diffusion-weighted axial and coronal sequences, axial T2 weighted sequence, and sagittal T1 weighted sequence was obtained. Brain: No restricted diffusion to suggest acute or subacute infarct. No acute hemorrhage, mass, mass effect, or midline shift. No hydrocephalus or extra-axial collection. No evidence of remote cortical or lacunar infarct on the axial T2 or sagittal T1 weighted sequences. The suspected prior right cerebellar infarct noted on the same day CT head may have been artifactual. Vascular: Normal arterial flow voids. Skull and upper cervical spine: Normal marrow signal. Right frontal osteoma. Sinuses/Orbits: Air-fluid level in the right maxillary sinus, with mucosal thickening in the right greater than left ethmoid air cells and right frontal sinus. No acute finding in the orbits. Other: The mastoid air cells are well aerated. IMPRESSION: 1. Evaluation is limited as the patient was not able to complete the exam. Within this limitation, there is no acute intracranial process. No evidence of  acute or subacute infarct. 2. Air-fluid level in the right maxillary sinus, which can be seen in the setting of acute sinusitis. Electronically Signed   By: Wiliam Ke M.D.   On: 06/18/2023 21:16   CT ANGIO HEAD NECK W WO CM (CODE STROKE)  Result Date: 06/18/2023 CLINICAL DATA:  Right extremity weakness. EXAM: CT ANGIOGRAPHY HEAD AND NECK WITH AND WITHOUT CONTRAST TECHNIQUE: Multidetector CT imaging of the head and neck was performed using the standard protocol during bolus administration of intravenous contrast. Multiplanar CT image reconstructions and MIPs were obtained to evaluate the vascular anatomy. Carotid stenosis measurements (when applicable) are obtained utilizing NASCET criteria, using the distal internal carotid diameter as the denominator. RADIATION DOSE REDUCTION: This exam was performed according to the departmental dose-optimization program which includes automated exposure control, adjustment of the mA and/or kV according to patient size and/or use of iterative reconstruction technique. CONTRAST:  75mL OMNIPAQUE IOHEXOL 350 MG/ML SOLN COMPARISON:  Same-day noncontrast CT head FINDINGS: CTA NECK FINDINGS Aortic arch: The imaged aortic arch is normal. The origins of the major branch vessels are patent. The subclavian arteries are patent to the level imaged. Right carotid system: The right common, internal, and external carotid arteries are patent, without hemodynamically significant stenosis or occlusion. There is no evidence of dissection or aneurysm. Left carotid system: Left common, internal, and external carotid arteries are patent, without hemodynamically significant stenosis or occlusion there is no evidence of dissection or aneurysm. Vertebral arteries: Vertebral arteries are patent, without hemodynamically significant stenosis or occlusion there is no evidence of dissection or aneurysm.  Skeleton: There is no acute osseous abnormality or suspicious osseous lesion. There is no visible  canal hematoma. Other neck: The soft tissues of the neck are unremarkable. Upper chest: The imaged lung apices are clear. Review of the MIP images confirms the above findings CTA HEAD FINDINGS Anterior circulation: The intracranial ICAs are normal. The bilateral MCAs and ACAS are normal. The anterior communicating artery is normal. There is no aneurysm or AVM. Posterior circulation: The bilateral V4 segments are normal. The basilar artery is normal. The major cerebellar arteries appear normal. The bilateral PCAs are normal. A right posterior communicating artery is identified. There is no aneurysm or AVM. Venous sinuses: Patent. Anatomic variants: None. Review of the MIP images confirms the above findings IMPRESSION: Normal CTA of the head neck. Electronically Signed   By: Lesia Hausen M.D.   On: 06/18/2023 17:49   CT HEAD CODE STROKE WO CONTRAST  Result Date: 06/18/2023 CLINICAL DATA:  Code stroke. Left-sided facial droop. Right-sided extremity weakness. EXAM: CT HEAD WITHOUT CONTRAST TECHNIQUE: Contiguous axial images were obtained from the base of the skull through the vertex without intravenous contrast. RADIATION DOSE REDUCTION: This exam was performed according to the departmental dose-optimization program which includes automated exposure control, adjustment of the mA and/or kV according to patient size and/or use of iterative reconstruction technique. COMPARISON:  CT head without contrast 03/03/2023. FINDINGS: Brain: No acute infarct, hemorrhage, or mass lesion is present. No significant white matter lesions are present. Deep brain nuclei are within normal limits. Insular ribbon is normal bilaterally. The ventricles are of normal size. No significant extraaxial fluid collection is present. In inferior right cerebellar nonhemorrhagic infarct is better visualized than on the prior study. Portions of this may have been present previously. Acute progression is not excluded. The brainstem and left cerebellum  are within normal limits. Vascular: No hyperdense vessel or unexpected calcification. Skull: A right frontal scalp osteoma is stable. Calvarium is otherwise unremarkable. Soft scalp is within normal limits. Sinuses/Orbits: Fluid is present in the right maxillary sinus and right frontal sinus. Scattered anterior ethmoid air cells are opacified on the right. The paranasal sinuses and mastoid air cells are otherwise clear. ASPECTS [facility] Stroke Program Early CT Score) - Ganglionic level infarction (caudate, lentiform nuclei, internal capsule, insula, M1-M3 cortex): 7/7 - Supraganglionic infarction (M4-M6 cortex): 3/3 Total score (0-10 with 10 being normal): 10/10 IMPRESSION: 1. 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 Portions of this may have been present previously. Acute progression is not excluded. Recommend MRI for further evaluation. 2. Normal appearance of the supratentorial brain. 3. Aspects is 10/10. The above was relayed via text pager to Dr. Caryl Pina on 06/18/2023 at 16:54. Electronically Signed   By: Marin Roberts M.D.   On: 06/18/2023 16:55    Pending Labs Unresulted Labs (From admission, onward)     Start     Ordered   06/18/23 2151  Hemoglobin A1c  Once,   R        06/18/23 2152   06/18/23 2151  Lipid panel  Once,   R        06/18/23 2152   06/18/23 2151  Rapid urine drug screen (hospital performed)  ONCE - STAT,   STAT        06/18/23 2152   06/18/23 2151  TSH  Once,   R        06/18/23 2152   06/18/23 2149  HIV Antibody (routine testing w rflx)  (  HIV Antibody (Routine testing w reflex) panel)  Once,   R        06/18/23 2152            Vitals/Pain Today's Vitals   06/18/23 1645 06/18/23 1747 06/18/23 2130 06/18/23 2132  BP: (!) 151/136 (!) 146/113 (!) 171/104   Pulse: 94 93 94   Resp: 18 18 16    Temp:  98.2 F (36.8 C)  97.8 F (36.6 C)  TempSrc:  Oral  Oral  SpO2: 100% 97% 100%   Weight:      Height:       PainSc:        Isolation Precautions No active isolations  Medications Medications  acetaminophen (TYLENOL) tablet 650 mg (has no administration in time range)    Or  acetaminophen (TYLENOL) suppository 650 mg (has no administration in time range)  prochlorperazine (COMPAZINE) injection 10 mg (10 mg Intravenous Given 06/18/23 1720)  iohexol (OMNIPAQUE) 350 MG/ML injection 75 mL (75 mLs Intravenous Contrast Given 06/18/23 1731)  LORazepam (ATIVAN) injection 1 mg (1 mg Intravenous Given 06/18/23 2029)    Mobility walks     Focused Assessments Cardiac Assessment Handoff:  Cardiac Rhythm: Normal sinus rhythm Lab Results  Component Value Date   TROPONINI <0.03 02/03/2018   Lab Results  Component Value Date   DDIMER 0.31 03/23/2023   Does the Patient currently have chest pain? Yes   , Neuro Assessment Handoff:  Swallow screen pass? Yes  Cardiac Rhythm: Normal sinus rhythm NIH Stroke Scale  Dizziness Present: No Headache Present: Yes Level of Consciousness (1a.)   : Alert, keenly responsive LOC Questions (1b. )   : Answers both questions correctly LOC Commands (1c. )   : Performs both tasks correctly Best Gaze (2. )  : Normal Visual (3. )  : No visual loss Facial Palsy (4. )    : Normal symmetrical movements Motor Arm, Left (5a. )   : No drift Motor Arm, Right (5b. ) : Drift Motor Leg, Left (6a. )  : No drift Motor Leg, Right (6b. ) : Drift Limb Ataxia (7. ): Present in one limb Sensory (8. )  : Mild-to-moderate sensory loss, patient feels pinprick is less sharp or is dull on the affected side, or there is a loss of superficial pain with pinprick, but patient is aware of being touched Best Language (9. )  : No aphasia Dysarthria (10. ): Normal Extinction/Inattention (11.)   : No Abnormality Complete NIHSS TOTAL: 4 Last date known well:  (unclear) Last time known well:  (unclear) Neuro Assessment:   Neuro Checks:      Has TPA been given? No If patient is a Neuro  Trauma and patient is going to OR before floor call report to 4N Charge nurse: [phone] or [phone]   R Recommendations: See Admitting Provider Note  Report given to:   Additional Notes:

## 2023-06-18 NOTE — Assessment & Plan Note (Addendum)
-   Refer to [facility] for PCP - A1c - Lipid panel

## 2023-06-18 NOTE — Code Documentation (Signed)
Stroke Response Nurse Documentation Code Documentation  Brittany Graves is a 37 y.o. female arriving to [facility]  via [employer] on 06-18-2023 with past medical hx of HTN. On No antithrombotic. Code stroke was activated by ED.   Patient from home where she was LKW which is unclear. Now complaining of a head ache, feeling short of breath, light headed and difficulty with her Right arm and leg.  She states that she has been unable to stay awake for the past 2 days.    Stroke team at the bedside on patient arrival. Labs drawn and patient cleared for CT by Dr. Deretha Emory. Patient to CT with team. NIHSS 6, see documentation for details and code stroke times. Patient with decreased LOC, right hemianopia, right arm weakness, right leg weakness, and right decreased sensation on exam. The following imaging was completed:  CT Head and CTA. Patient is not a candidate for IV Thrombolytic due to LKW is unclear and symptoms are mild,  Plain film CT shows CVA which wasn't there on previous CT scan per Radiology. Patient is not a candidate for IR due to no LVO on CTA.   Care Plan: VS and NIHSS q 2 hours x 12 hours   Bedside handoff with ED RN Meta Hatchet.    Marcellina Millin  Stroke Response RN

## 2023-06-18 NOTE — Assessment & Plan Note (Addendum)
Patient states no history of chronic HA or migraines, however she was seen in ED March 2024 for HA. Patient also has not been taking BP medication due to not having a PCP to prescribe; this may be contributing. - monitor BP; allow permissive HTN due to concern of TIA. Spot dose if BP >220/110. - Tylenol Q6H prn

## 2023-06-18 NOTE — ED Provider Notes (Signed)
[place] [facility] DE[facility]MENT AT [facility] Provider Note   CSN: [id number] Arrival date & time: 06/18/23  1621     History  Chief Complaint  Patient presents with   Headache   Shortness of Breath    Brittany Graves is a 37 y.o. female.  Patient from triage activated code stroke.  Patient seem to think onset of symptoms were between 3 and 4 this afternoon.  But patient also stated she been in bed for the past 3 days that she been very tired she was sleeping all this time.  Patient with complaint of a headache on the right side.  Patient also with numbness to the right side of the face right upper extremity and right lower extremity also weakness to right upper extremity and right lower extremity.  No evidence of any facial droop at this time.  Patient states patient is okay.  Patient states that she has been out of her blood pressure medicine that she has a history of hypertension.  Patient states she does not have a history of chronic headaches or migraines.  The patient was seen in March for headache.  That was the last time she had a head CT which was read as normal.  Past medical history only significant for hypertension.  Patient does smoke some days.       Home Medications Prior to Admission medications   Medication Sig Start Date End Date Taking? Authorizing Provider  amLODipine (NORVASC) 5 MG tablet Take 1 tablet (5 mg total) by mouth daily. Patient not taking: Reported on 03/23/2023 10/08/21 11/07/21  Haskel Schroeder, PA-C  ferrous sulfate 325 (65 FE) MG tablet Take 1 tablet (325 mg total) by mouth daily. 03/23/23   Gwyneth Sprout, MD  hydrochlorothiazide (HYDRODIURIL) 25 MG tablet Take 1 tablet (25 mg total) by mouth daily. 03/03/23   Prosperi, Christian H, PA-C  HYDROcodone-acetaminophen (NORCO/VICODIN) 5-325 MG tablet Take 1 tablet by mouth every 6 (six) hours as needed. 05/06/23   Mannie Stabile, PA-C  methocarbamol (ROBAXIN) 500 MG tablet Take 1  tablet (500 mg total) by mouth 2 (two) times daily. 05/06/23   Mannie Stabile, PA-C  naproxen (NAPROSYN) 500 MG tablet Take 1 tablet (500 mg total) by mouth 2 (two) times daily. 05/06/23   Mannie Stabile, PA-C  potassium chloride (KLOR-CON) 10 MEQ tablet Take 2 tablets (20 mEq total) by mouth daily. Patient taking differently: Take 10 mEq by mouth daily. 03/03/23   Prosperi, Christian H, PA-C      Allergies    Patient has no known allergies.    Review of Systems   Review of Systems  Constitutional:  Negative for chills and fever.  HENT:  Negative for ear pain and sore throat.   Eyes:  Negative for pain and visual disturbance.  Respiratory:  Negative for cough and shortness of breath.   Cardiovascular:  Negative for chest pain and palpitations.  Gastrointestinal:  Positive for nausea. Negative for abdominal pain and vomiting.  Genitourinary:  Negative for dysuria and hematuria.  Musculoskeletal:  Negative for arthralgias and back pain.  Skin:  Negative for color change and rash.  Neurological:  Positive for weakness, numbness and headaches. Negative for seizures, syncope and speech difficulty.  Psychiatric/Behavioral:  Positive for confusion.   All other systems reviewed and are negative.   Physical Exam Updated Vital Signs BP (!) 159/115 (BP Location: Right Arm)   Pulse (!) 118   Temp 99.6 F (37.6 C) (Oral)  Resp 18   Ht 1.626 m (5\' 4" )   Wt 106.6 kg   SpO2 98%   BMI 40.34 kg/m  Physical Exam Vitals and nursing note reviewed.  Constitutional:      General: She is not in acute distress.    Appearance: Normal appearance. She is well-developed.  HENT:     Head: Normocephalic and atraumatic.  Eyes:     Extraocular Movements: Extraocular movements intact.     Conjunctiva/sclera: Conjunctivae normal.     Pupils: Pupils are equal, round, and reactive to light.  Cardiovascular:     Rate and Rhythm: Normal rate and regular rhythm.     Heart sounds: No murmur  heard. Pulmonary:     Effort: Pulmonary effort is normal. No respiratory distress.     Breath sounds: Normal breath sounds.  Abdominal:     Palpations: Abdomen is soft.     Tenderness: There is no abdominal tenderness.  Musculoskeletal:        General: No swelling.     Cervical back: Normal range of motion and neck supple. No tenderness.  Skin:    General: Skin is warm and dry.     Capillary Refill: Capillary refill takes less than 2 seconds.  Neurological:     Mental Status: She is alert.     Cranial Nerves: Cranial nerve deficit present.     Sensory: Sensory deficit present.     Motor: Weakness present.  Psychiatric:        Mood and Affect: Mood normal.     ED Results / Procedures / Treatments   Labs (all labs ordered are listed, but only abnormal results are displayed) Labs Reviewed  CBC - Abnormal; Notable for the following components:      Result Value   Hemoglobin 8.6 (*)    HCT 29.9 (*)    MCV 66.9 (*)    MCH 19.2 (*)    MCHC 28.8 (*)    RDW 18.0 (*)    All other components within normal limits  COMPREHENSIVE METABOLIC PANEL - Abnormal; Notable for the following components:   Potassium 3.2 (*)    Glucose, Bld 100 (*)    BUN 5 (*)    All other components within normal limits  I-STAT CHEM 8, ED - Abnormal; Notable for the following components:   Potassium 3.3 (*)    BUN 3 (*)    Calcium, Ion 1.12 (*)    Hemoglobin 10.2 (*)    HCT 30.0 (*)    All other components within normal limits  PROTIME-INR  APTT  DIFFERENTIAL  ETHANOL  HCG, SERUM, QUALITATIVE  CBG MONITORING, ED    EKG EKG Interpretation Date/Time:  Thursday June 18 2023 16:05:44 EDT Ventricular Rate:  100 PR Interval:  146 QRS Duration:  78 QT Interval:  340 QTC Calculation: 438 R Axis:   40  Text Interpretation: Normal sinus rhythm Nonspecific T wave abnormality Abnormal ECG When compared with ECG of 06-May-2023 09:05, PREVIOUS ECG IS PRESENT New since previous tracing Confirmed by  Vanetta Mulders [phone]) on 06/18/2023 5:21:12 PM  Radiology CT HEAD CODE STROKE WO CONTRAST  Result Date: 06/18/2023 CLINICAL DATA:  Code stroke. Left-sided facial droop. Right-sided extremity weakness. EXAM: CT HEAD WITHOUT CONTRAST TECHNIQUE: Contiguous axial images were obtained from the base of the skull through the vertex without intravenous contrast. RADIATION DOSE REDUCTION: This exam was performed according to the departmental dose-optimization program which includes automated exposure control, adjustment of the mA and/or kV according to  patient size and/or use of iterative reconstruction technique. COMPARISON:  CT head without contrast 03/03/2023. FINDINGS: Brain: No acute infarct, hemorrhage, or mass lesion is present. No significant white matter lesions are present. Deep brain nuclei are within normal limits. Insular ribbon is normal bilaterally. The ventricles are of normal size. No significant extraaxial fluid collection is present. In inferior right cerebellar nonhemorrhagic infarct is better visualized than on the prior study. Portions of this may have been present previously. Acute progression is not excluded. The brainstem and left cerebellum are within normal limits. Vascular: No hyperdense vessel or unexpected calcification. Skull: A right frontal scalp osteoma is stable. Calvarium is otherwise unremarkable. Soft scalp is within normal limits. Sinuses/Orbits: Fluid is present in the right maxillary sinus and right frontal sinus. Scattered anterior ethmoid air cells are opacified on the right. The paranasal sinuses and mastoid air cells are otherwise clear. ASPECTS [facility] Stroke Program Early CT Score) - Ganglionic level infarction (caudate, lentiform nuclei, internal capsule, insula, M1-M3 cortex): 7/7 - Supraganglionic infarction (M4-M6 cortex): 3/3 Total score (0-10 with 10 being normal): 10/10 IMPRESSION: 1. Inferior right cerebellar nonhemorrhagic infarct is better visualized than on  the prior study. Portions of this may have been present previously. Acute progression is not excluded. Recommend MRI for further evaluation. 2. Normal appearance of the supratentorial brain. 3. Aspects is 10/10. The above was relayed via text pager to Dr. Caryl Pina on 06/18/2023 at 16:54. Electronically Signed   By: Marin Roberts M.D.   On: 06/18/2023 16:55    Procedures Procedures    Medications Ordered in ED Medications  prochlorperazine (COMPAZINE) injection 10 mg (10 mg Intravenous Given 06/18/23 1720)    ED Course/ Medical Decision Making/ A&P                             Medical Decision Making Amount and/or Complexity of Data Reviewed Labs: ordered. Radiology: ordered.   CRITICAL CARE Performed by: Vanetta Mulders Total critical care time: 45 minutes Critical care time was exclusive of separately billable procedures and treating other patients. Critical care was necessary to treat or prevent imminent or life-threatening deterioration. Critical care was time spent personally by me on the following activities: development of treatment plan with patient and/or surrogate as well as nursing, discussions with consultants, evaluation of patient's response to treatment, examination of patient, obtaining history from patient or surrogate, ordering and performing treatments and interventions, ordering and review of laboratory studies, ordering and review of radiographic studies, pulse oximetry and re-evaluation of patient's condition.  Patient's blood sugar is 99.  Potassium down a little bit at 3.3.  CBC no leukocytosis hemoglobin a little low at 8.6.  Platelets are good.  Complete metabolic panel significant for potassium of 3.2 otherwise negative.  Renal function is normal.  CT head inferior right cerebellar nonhemorrhagic infarct possibly present on previous study in March.  This could be an acute progression.  Will review EKG.  In discussion with Dr. Otelia Limes stroke  neurologist they are going to do CT angio head and neck and then she is going to get MRI.  They are not doing TNK.  Patient's timelines a little fuzzy.    Final Clinical Impression(s) / ED Diagnoses Final diagnoses:  Cerebrovascular accident (CVA), unspecified mechanism [facility])    Rx / DC Orders ED Discharge Orders     None         Vanetta Mulders, MD 06/18/23 1723

## 2023-06-18 NOTE — Consult Note (Addendum)
NEURO HOSPITALIST CONSULT NOTE   Requesting physician: Dr. Deretha Emory  Reason for Consult: Acute onset of right sided weakness  History obtained from:  Patient and Chart     HPI:                                                                                                                                          Brittany Graves is an 37 y.o. female with a PMHx of HTN who presents to the ED with new onset of right facial droop, right arm and right leg weakness in the context of a pulsating right anterior and lateral frontal headache currently rated as 8/10 in intensity, along with photophobia. LKN was the same as time of symptom recognition: 1600 today. The headache started 2 days ago. She endorses not being able to sleep for the past 2 days, but has started to fall asleep spontaneously during daytime hours due to the sleep deprivation. Code Stroke was called after she arrived to the ED. She states that over the past 2 days, she has been seeing "black spots" in her vision after closing her eyes and opening them, but has not determined if this is in one eye or both eyes. The spots fade after her eyes are left open.   Past Medical History:  Diagnosis Date   Hypertension     Past Surgical History:  Procedure Laterality Date   TUBAL LIGATION      Family History  Problem Relation Age of Onset   Hypertension Mother    Diabetes Mother             Social History:  reports that she has been smoking. She has never used smokeless tobacco. She reports that she does not drink alcohol. No history on file for drug use.  No Known Allergies  HOME MEDICATIONS:                                                                                                                      No current facility-administered medications on file prior to encounter.   Current Outpatient Medications on File Prior to Encounter  Medication Sig Dispense Refill   amLODipine (NORVASC) 5 MG tablet Take  1 tablet (5 mg total) by mouth daily. (Patient not taking:  Reported on 03/23/2023) 30 tablet 0   ferrous sulfate 325 (65 FE) MG tablet Take 1 tablet (325 mg total) by mouth daily. 30 tablet 0   hydrochlorothiazide (HYDRODIURIL) 25 MG tablet Take 1 tablet (25 mg total) by mouth daily. 30 tablet 0   HYDROcodone-acetaminophen (NORCO/VICODIN) 5-325 MG tablet Take 1 tablet by mouth every 6 (six) hours as needed. 8 tablet 0   methocarbamol (ROBAXIN) 500 MG tablet Take 1 tablet (500 mg total) by mouth 2 (two) times daily. 20 tablet 0   naproxen (NAPROSYN) 500 MG tablet Take 1 tablet (500 mg total) by mouth 2 (two) times daily. 30 tablet 0   potassium chloride (KLOR-CON) 10 MEQ tablet Take 2 tablets (20 mEq total) by mouth daily. (Patient taking differently: Take 10 mEq by mouth daily.) 60 tablet 0     ROS:                                                                                                                                       The patient is a poor historian and somewhat oppositional, declining to give further information regarding her symptoms other than the above.    Blood pressure (!) 159/115, pulse (!) 118, temperature 99.6 F (37.6 C), temperature source Oral, resp. rate 18, height 5\' 4"  (1.626 m), weight 106.6 kg, SpO2 98%.   General Examination:                                                                                                       Physical Exam  HEENT:  [place]/AT Lungs: Respirations unlabored Extremities: Warm and well perfused.   Neurological Examination Mental Status: Awake and alert. Oriented to self, the month, her age, the city and state, but not the year or the day of the week. Speech is fluent with intact naming and comprehension. Tearful and dysthymic affect. No dysarthria.  Cranial Nerves: II: PERRL OS: Field cut in both nasal quadrants. Temporal quadrants normal.  OD: Tunnel vision with impaired vision in the periphery of all 4 quadrants.  When testing  DSS to temporal visual fields of both eyes simultaneously, she has extinction on the right with one trial, then sees both hands simultaneously.   III,IV, VI: No ptosis. EOMI. No nystagmus. Does have fluttering of her eyelids and intermittently shuts eyes as though going to sleep midway through several questions.  V: FT sensation equal bilaterally VII: Grimace symmetric VIII: Hearing intact to conversation IX,X: No hoarseness or hypophonia XI: Symmetric  XII: Midline tongue extension Motor: Irregular giveway weakness in all 4 limbs, somewhat more frequent on the right.  Positive upper extremities Hoover sign Maximum strength elicitable in all muscle groups is 5/5, but with giveway strength varies from 0-4/5.  Sensory: Decreased gross touch to RUE and RLE Deep Tendon Reflexes: 2+ bilateral brachioradialis. 3+ bilateral patellae. 1+ bilateral achilles. Toes downgoing.  Cerebellar: No ataxia with FNF bilaterally, but with slow movements. Unable to perform H-S. Gait: Deferred   Lab Results: Basic Metabolic Panel: Recent Labs  Lab 06/18/23 1642  NA 139  K 3.3*  CL 104  GLUCOSE 95  BUN 3*  CREATININE 0.60    CBC: Recent Labs  Lab 06/18/23 1642  HGB 10.2*  HCT 30.0*    Cardiac Enzymes: No results for input(s): "CKTOTAL", "CKMB", "CKMBINDEX", "TROPONINI" in the last 168 hours.  Lipid Panel: No results for input(s): "CHOL", "TRIG", "HDL", "CHOLHDL", "VLDL", "LDLCALC" in the last 168 hours.  Imaging: No results found.   Assessment: 37 year old female with acute onset of right sided weakness in conjunction with severe pulsating headache with photophobia - Neurology exam with giveway weakness obscuring possible lesional deficit, in addition to tearfulness, apparent somnolence alternating with a fully alert state and visual field deficits.  - CT head: Inferior right cerebellar hypodensity may be artifactual. Normal appearance of the supratentorial brain. Aspects is 10/10. - CTA of  head and neck: Normal - Given equivocal nature of her patchy neurological deficits in conjunction with giveway weakness and headache, the DDx includes conversion disorder, secondary gain, stroke and complicated migraine. Due to lowered probability of ischemic stroke, in conjunction with risks versus benefits of TNK, overall impression is that risks outweigh potential benefits. The patient elected not to be treated with TNK.  - MRI will be obtained to rule out stroke.   Recommendations: - MRI brain.  - Compazine 10 mg IV x 1 - TTE - Cardiac telemetry - Permissive HTN x 24 hours - TIA alert with frequent neuro checks - ASA 81 mg po every day - HgbA1c, fasting lipid panel - PT consult, OT consult, Speech consult - Risk factor modification - NPO until passes stroke swallow screen    Electronically signed: Dr. Caryl Pina 06/18/2023, 4:48 PM

## 2023-06-18 NOTE — ED Notes (Signed)
PT addressed a concern over feeling closed in,told the PT it may be anxiety and that the Ativan would help with that

## 2023-06-18 NOTE — ED Triage Notes (Signed)
Patient arrives ambulatory by POV c/o headache x 2 days. States while walking up the steps today she began feeling short of breath and light headed. Reports having stiffness to right side of face and right leg. Also having light sensitivity and feeling hot. Patient adds she is in the process of finding a PCP but she is out of her potassium and BP meds.

## 2023-06-18 NOTE — ED Notes (Signed)
Patient transported to MRI 

## 2023-06-18 NOTE — H&P (Addendum)
Hospital Admission History and Physical Service Pager: [phone]  Patient name: Brittany Graves Medical record number: [id number] Date of Birth: 08/22/1986 Age: 37 y.o. Gender: female  Primary Care Provider: Patient, No Pcp Per Consultants: Neurology Code Status: FULL which was confirmed with family if patient unable to confirm   Preferred Emergency Contact:  Contact Information     Name Relation Home Work [place] Mother   [phone]      Other Contacts     Name Relation Home Work Mobile   Berneice Heinrich [phone]         Chief Complaint: Headache, Right sided face and leg weakness  Assessment and Plan: Brittany Graves is a 37 y.o. female presenting with headache x2 days and new onset Right sided face and leg weakness. Differential for presentation of this includes TIA, complex migraine, temporal arteritis, sinusitis, seizure, dehydration. Highly suspicious for complex migraine given patient constellation of symptoms (headaches, vision changes, fatigue for the past few days). However, sinusitis seen on MRI must also be considered as a contributing factor in current presentation.  Patient is agreeable to being admitted for observation and further workup tonight. She also agrees she needs a PCP and is interested in being connected with these services during her admission.  [facility]     * (Principal) Weakness     Suspected stroke, but not apparent on MRI. Also considering complex  migraine/hemiplegic migraine vs TIA especially given risk factors  (long-time smoker, hypertension not controlled) - Admit to FMTS, attending Dr. McDiarmid - Progressive, Vital signs per floor - Regular diet  - PT/OT to treat - VTE prophylaxis: SCDs - AM CBC/BMP  - Fall precautions - UDS - A1c - Lipid panel - Awaiting neuro reccs        HTN (hypertension)     Patient has been out of her BP medication for at least 2 weeks. - monitor  BP - resume oral antihypertensive as appropriate; allow permissive HTN x 24  hrs per neuro        Headache     Patient states no history of chronic HA or migraines, however she was  seen in ED March 2024 for HA. Patient also has not been taking BP  medication due to not having a PCP to prescribe; this may be contributing. - monitor BP; allow permissive HTN due to concern of TIA. Spot dose if BP  >220/110. - Tylenol Q6H prn         Does not have primary care provider     - TOC for PCP         Anemia     Severely microcytic, MCV 66, Hgb 8.6 on admission. Mentzer index 14,  likely iron deficient anemia. - Iron, TIBC, Ferritin labs - Monitor CBC       FEN/GI: Regular diet VTE Prophylaxis: SCDs  Disposition: Progressive  History of Present Illness:  Brittany Graves is a 38 y.o. female presenting with headache which started 2 days ago. She reports she has been sleeping a lot lately due to not feeling well. Just prior to arrival she developed Right sided weakness. She did try Tylenol for her headache which helped. Headache is right sided and frontal, described as throbbing/pulsing. She denies nausea, vomiting, fevers, chills. Reports sometimes her vision gets blurry and black spots appear; this does seem to be related to her headaches.  When this happens she sometimes gets a  clammy, nauseating feeling.  She also states that she needs glasses.  Patient does not have a PCP.  She is prescribed amlodipine and HCTZ from a prior ED visit.  Her visits to the ED are the only time that she gets her medications.  She has been out of these medications for approximately 2 weeks.  LKW seems to be 1600 today, but patient's timeline is somewhat unclear.   In the ED, labs showed K+ 3.3, Hgb 8.6. Renal function is normal. CT Head showed inferior right cerebellar nonhemorrhagic infarct possibly present on previous study in March. EKG showed NSR. Neurology was consulted and a CTA Head and Neck was  ordered; this was negative. MRI showed no evidence of infarct, though exam was limited due to patient intolerance. Regardless, patient was not a candidate for TNK due to unclear LKW. Dr. Otelia Limes with Neurology recommended admission for complete TIA workup.  Review Of Systems: Per HPI.  Pertinent Past Medical History: HTN Remainder reviewed in history tab.   Pertinent Past Surgical History: Tubal ligation  Remainder reviewed in history tab.  Pertinent Social History: Tobacco use: Yes-  1 PPD, long time Alcohol use: None Other Substance use: None Lives with herself, 3 kids, boyfriend  Pertinent Family History: Mom- CAD Sister- HTN Grandma- HTN, T2DM Father- HTN, T2DM Remainder reviewed in history tab.   Important Outpatient Medications: Hydrochlorothiazide 25mg  Amlodipine 5mg  Remainder reviewed in medication history.   Objective: BP (!) 147/89 (BP Location: Left Arm)   Pulse 78   Temp 97.7 F (36.5 C) (Oral)   Resp 18   Ht 5\' 6"  (1.676 m)   Wt 94.7 kg   SpO2 100%   BMI 33.70 kg/m  Exam: General: Pleasant, no acute distress, somewhat disheveled HEENT: 2 cm firm nodule over right foreahead Cardio: RRR, no murmurs on exam. Pulm: CTA bilaterally. No increased work of breathing. On room air. Abdominal: bowel sounds present, soft, non-tender, non-distended Extremities: no peripheral edema  Neuro: Decreased strength in both Right UE and LE. Decreased right grip strength compared to left. Alert and oriented x3, speech normal in content, no facial asymmetry, pupils equal and reactive to light.  Psych:  Cognition and judgment appear intact. Alert, communicative  and cooperative with normal attention span and concentration. No apparent delusions, illusions, hallucinations.  Labs:  CBC BMET  Recent Labs  Lab 06/18/23 1637 06/18/23 1642  WBC 6.9  --   HGB 8.6* 10.2*  HCT 29.9* 30.0*  PLT 381  --    Recent Labs  Lab 06/18/23 1637 06/18/23 1642  NA 138 139  K 3.2*  3.3*  CL 105 104  CO2 23  --   BUN 5* 3*  CREATININE 0.73 0.60  GLUCOSE 100* 95  CALCIUM 9.1  --      EKG: NSR  Imaging Studies Performed: 06/18/23 CTA Head and Neck: Normal CTA of the head neck.  06/18/23 CT Head:  1. 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 Portions of this may have been present previously. Acute progression is not excluded. Recommend MRI for further evaluation. 2. Normal appearance of the supratentorial brain. 3. Aspects is 10/10. 06/18/23 MRI Brain:  1. Evaluation is limited as the patient was not able to complete the exam. Within this limitation, there is no acute intracranial process. No evidence of acute or subacute infarct. 2. Air-fluid level in the right maxillary sinus, which can be seen in the setting of acute sinusitis.   Darral Dash, DO  06/19/2023, 6:32 AM PGY-1, [facility] Family Medicine  FPTS Intern pager: [phone], text pages welcome Secure chat group [facility] Teaching Service

## 2023-06-19 DIAGNOSIS — F17200 Nicotine dependence, unspecified, uncomplicated: Secondary | ICD-10-CM | POA: Insufficient documentation

## 2023-06-19 DIAGNOSIS — R531 Weakness: Secondary | ICD-10-CM | POA: Diagnosis not present

## 2023-06-19 DIAGNOSIS — G43409 Hemiplegic migraine, not intractable, without status migrainosus: Secondary | ICD-10-CM | POA: Diagnosis not present

## 2023-06-19 DIAGNOSIS — D509 Iron deficiency anemia, unspecified: Secondary | ICD-10-CM | POA: Insufficient documentation

## 2023-06-19 DIAGNOSIS — Z716 Tobacco abuse counseling: Secondary | ICD-10-CM | POA: Insufficient documentation

## 2023-06-19 LAB — CBC
HCT: 29.2 % — ABNORMAL LOW (ref 36.0–46.0)
Hemoglobin: 8.5 g/dL — ABNORMAL LOW (ref 12.0–15.0)
MCH: 19.5 pg — ABNORMAL LOW (ref 26.0–34.0)
MCHC: 29.1 g/dL — ABNORMAL LOW (ref 30.0–36.0)
MCV: 66.8 fL — ABNORMAL LOW (ref 80.0–100.0)
Platelets: 335 10*3/uL (ref 150–400)
RBC: 4.37 MIL/uL (ref 3.87–5.11)
RDW: 18 % — ABNORMAL HIGH (ref 11.5–15.5)
WBC: 6.7 10*3/uL (ref 4.0–10.5)
nRBC: 0 % (ref 0.0–0.2)

## 2023-06-19 LAB — LIPID PANEL
Cholesterol: 155 mg/dL (ref 0–200)
HDL: 36 mg/dL — ABNORMAL LOW (ref >40)
LDL Cholesterol: 93 mg/dL (ref 0–99)
Total CHOL/HDL Ratio: 4.3 RATIO
Triglycerides: 130 mg/dL (ref <150)
VLDL: 26 mg/dL (ref 0–40)

## 2023-06-19 LAB — IRON AND TIBC
Iron: 11 ug/dL — ABNORMAL LOW (ref 28–170)
Saturation Ratios: 2 % — ABNORMAL LOW (ref 10.4–31.8)
TIBC: 466 ug/dL — ABNORMAL HIGH (ref 250–450)
UIBC: 455 ug/dL

## 2023-06-19 LAB — HIV ANTIBODY (ROUTINE TESTING W REFLEX): HIV Screen 4th Generation wRfx: NONREACTIVE

## 2023-06-19 LAB — FERRITIN: Ferritin: 1 ng/mL — ABNORMAL LOW (ref 11–307)

## 2023-06-19 LAB — TSH: TSH: 3.49 u[IU]/mL (ref 0.350–4.500)

## 2023-06-19 MED ORDER — NICOTINE 14 MG/24HR TD PT24: 14.0000 mg | MEDICATED_PATCH | Freq: Every day | TRANSDERMAL | 0 refills | Status: DC

## 2023-06-19 MED ORDER — POLYETHYLENE GLYCOL 3350 17 GM/SCOOP PO POWD: 17.0000 g | Freq: Every day | ORAL | 0 refills | Status: DC

## 2023-06-19 MED ORDER — VERAPAMIL HCL ER 120 MG PO TBCR: 120.0000 mg | EXTENDED_RELEASE_TABLET | Freq: Two times a day (BID) | ORAL | 0 refills | Status: DC

## 2023-06-19 MED ORDER — HYDROCHLOROTHIAZIDE 25 MG PO TABS
25.0000 mg | ORAL_TABLET | Freq: Every day | ORAL | Status: DC
  Administered 2023-06-19: 25 mg via ORAL
  Filled 2023-06-19: qty 1

## 2023-06-19 MED ORDER — IRON SUCROSE 500 MG IVPB - SIMPLE MED
500.0000 mg | Freq: Once | INTRAVENOUS | Status: DC
  Filled 2023-06-19: qty 275

## 2023-06-19 MED ORDER — SODIUM CHLORIDE 0.9 % IV SOLN
250.0000 mg | Freq: Once | INTRAVENOUS | Status: DC
  Filled 2023-06-19: qty 20

## 2023-06-19 MED ORDER — ORAL CARE MOUTH RINSE: 15.0000 mL | OROMUCOSAL | Status: DC | PRN

## 2023-06-19 MED ORDER — SODIUM CHLORIDE 0.9 % IV SOLN
500.0000 mg | Freq: Once | INTRAVENOUS | Status: AC
  Administered 2023-06-19: 500 mg via INTRAVENOUS
  Filled 2023-06-19: qty 25

## 2023-06-19 MED ORDER — SENNOSIDES-DOCUSATE SODIUM 8.6-50 MG PO TABS: 1.0000 | ORAL_TABLET | Freq: Every day | ORAL | 0 refills | Status: DC

## 2023-06-19 NOTE — Discharge Summary (Cosign Needed Addendum)
Family Medicine Teaching [facility] Discharge Summary  Patient name: Brittany Graves Medical record number: [id number] Date of birth: [birth year] Age: 37 y.o. Gender: female Date of Admission: 06/18/2023  Date of Discharge: 06/19/2023 Admitting Physician: Cyndia Skeeters, DO  Primary Care Provider: Patient, No Pcp Per Consultants: Neurology  Indication for Hospitalization: R sided weakness   Discharge Diagnoses/Problem List:  Principal Problem for Admission: Weakness  Other Problems addressed during stay:  Principal Problem:   Weakness Active Problems:   HTN (hypertension)   Headache   Does not have primary care provider   Iron deficiency anemia   Smoking    Brief Hospital Course:  Brittany Graves is a 37 y.o.female with a history of HTN, tobacco use, hidradenitis suppurativa who was admitted to the [facility] Medicine Teaching Service at [facility] for right- sided weakness concerning for TIA. Her hospital course is detailed below:  Right-sided weakness Code Stroke called in ED for right-sided numbness/weakness in upper and lower extremities around 4 hours prior to arrival. CT head suggestive of possible inferior right cerebellar nonhemorrhagic infarct. MRI, although limited, negative for acute or subacute infarct. Neurology consulted and recommeded to start ASA 81 mg daily, however we held off for the time being due to her severe iron deficiency anemia and negative stroke workup. Risk stratification labs obtained, TSH was normal and lipid panel did not show HLD. A1C pending at time of discharge.   Hypertension Patient reports getting prescriptions for BP medications through the ED when she comes in for acute complaints as she does not have a PCP. BP was moderately elevated at admission, but patient was allowed permissive HTN due to stroke-like symptoms. Patient was not restarted on home dose of hydrochlorothiazide due to starting verapamil for migraine prophylaxis.    Headache  Complex Migraines Patient reports having a headache for the past two days that she describes as pulsatile, unilateral (R), frontal, associated with nausea occasionally but not currently at this time, and disabling due to having right sided weakness. Her symptoms gave [place] concern for complex vs. hemiplegic migraine due to her normal CNS imaging and total R sided weakness. Started patient on prophylactic dose of Verapamil 120 mg BID.  Tobacco Use Smokes 1 PPD, unclear pack-year history but extensive, per patient. Treated with nicotine patch while inpatient.   PCP Follow-up Recommendations: Labs still pending - A1c Recheck blood pressure outpatient. DC hydrochlorothiazide at discharge.  Neurology recommended ASA 81 daily but this was held in the hospital in the setting of anemia, consider starting on follow up Consider abnormal uterine bleeding causing her severe iron deficiency anemia and possible IV iron infusions. As verapamil can increase constipation, consider discussing bowel regimen Consider XR of cervical and lumbar spine outpatient to assess for degenerative changes to rule out possible radiculopathy causing her weakness. Discuss smoking cessation options, if patient is interested.   Disposition: Home  Discharge Condition: Stable   Discharge Exam:  Vitals:   06/19/23 1148 06/19/23 1223  BP: (!) 146/88 (!) 133/90  Pulse: 80 77  Resp: 20 20  Temp: 98.7 F (37.1 C) 98.3 F (36.8 C)  SpO2: 100% 100%   General: NAD. Awake and alert. Cardiovascular: RRR. No M/R/G. Respiratory: CTAB. Normal WOB on RA.  Abdomen: soft, nontender, nondistended, bowel sounds present Extremities: no BLE edema, 3/5 R UE and LE weakness vs. 4/5 L UE and LE strength, able to walk on her own with slight instability of her tip toes and heels.  Physical exam done  by Dr. Fatima Blank  Significant Procedures: None  Significant Labs and Imaging:  Recent Labs  Lab 06/18/23 1637 06/18/23 1642  06/19/23 0635  WBC 6.9  --  6.7  HGB 8.6* 10.2* 8.5*  HCT 29.9* 30.0* 29.2*  PLT 381  --  335   Recent Labs  Lab 06/18/23 1637 06/18/23 1642  NA 138 139  K 3.2* 3.3*  CL 105 104  CO2 23  --   GLUCOSE 100* 95  BUN 5* 3*  CREATININE 0.73 0.60  CALCIUM 9.1  --   ALKPHOS 80  --   AST 17  --   ALT 16  --   ALBUMIN 3.7  --      Pertinent Imaging  CT head: Inferior R cerebellar nonhemorrhagic infarct visualized, acute progression not excluded, MRI recommended.  CT Angio Head and Neck: no acute process noted, normal CTA MRI Brain: limited d/t patient intolerance of procedure, noted no evidence of acute or subacute infarct   Results/Tests Pending at Time of Discharge: A1C  Discharge Medications:  Allergies as of 06/19/2023   No Known Allergies      Medication List     STOP taking these medications    hydrochlorothiazide 25 MG tablet Commonly known as: HYDRODIURIL       TAKE these medications    ferrous sulfate 325 (65 FE) MG tablet Take 1 tablet (325 mg total) by mouth daily.   nicotine 14 mg/24hr patch Commonly known as: NICODERM CQ - dosed in mg/24 hours Place 1 patch (14 mg total) onto the skin daily. Start taking on: June 20, 2023   polyethylene glycol powder 17 GM/SCOOP powder Commonly known as: GLYCOLAX/MIRALAX Take 17 g by mouth daily.   potassium chloride 10 MEQ tablet Commonly known as: KLOR-CON Take 2 tablets (20 mEq total) by mouth daily. What changed: how much to take   senna-docusate 8.6-50 MG tablet Commonly known as: Senokot-S Take 1 tablet by mouth daily.   verapamil 120 MG CR tablet Commonly known as: CALAN-SR Take 1 tablet (120 mg total) by mouth 2 (two) times daily.        Discharge Instructions: Please refer to Patient Instructions section of EMR for full details.  Patient was counseled important signs and symptoms that should prompt return to medical care, changes in medications, dietary instructions, activity restrictions,  and follow up appointments.   Follow-Up Appointments:  Follow-up Information     [name], DO. Go on 07/09/2023.   Why: You have an appointment with Dr. Fatima Blank on Thursday, August 1st at 1:45pm, please arrive 15 minutes early. Contact information: [address] [place] [address] [phone]                 Glendale Chard, DO 06/19/2023, 5:21 PM PGY-1, [facility] Family Medicine

## 2023-06-19 NOTE — Progress Notes (Signed)
Discharge instructions reviewed with patient utilizing teach back method no questions at this time. Patient discharged to home. ?

## 2023-06-19 NOTE — Progress Notes (Signed)
Subjective: Headache resolved. Right sided weakness is almost back to baseline.   Objective: Current vital signs: BP (!) 133/90 (BP Location: Left Arm)   Pulse 77   Temp 98.3 F (36.8 C) (Oral)   Resp 20   Ht 5\' 6"  (1.676 m)   Wt 94.7 kg   SpO2 100%   BMI 33.70 kg/m  Vital signs in last 24 hours: Temp:  [97.7 F (36.5 C)-98.7 F (37.1 C)] 98.3 F (36.8 C) (07/12 1223) Pulse Rate:  [77-94] 77 (07/12 1223) Resp:  [16-23] 20 (07/12 1223) BP: (133-181)/(88-113) 133/90 (07/12 1223) SpO2:  [97 %-100 %] 100 % (07/12 1223) Weight:  [94.7 kg] 94.7 kg (07/11 2344)  Intake/Output from previous day: 07/11 0701 - 07/12 0700 In: 240 [P.O.:240] Out: -  Intake/Output this shift: No intake/output data recorded. Nutritional status:  Diet Order             Diet regular Room service appropriate? Yes; Fluid consistency: Thin  Diet effective now                  Physical Exam  HEENT:  [place]/AT Lungs: Respirations unlabored Extremities: Warm and well perfused.    Neurological Examination Mental Status: Awake, alert and oriented. Speech is fluent with intact naming and comprehension. Bright/cheerful affect today. No dysarthria.  Cranial Nerves: II, III,IV, VI: Still with slight residual visual changes/photophobia. Fixates and tracks normally.  VII: Smile is symmetric VIII: Hearing intact to conversation IX,X: No hoarseness or hypophonia XI: Symmetric XII: No lingual dysarthria Motor: LUE and LLE 5/5 RUE 4+/5. No pronator drift RLE 4+/5 Sensory: Touch sensation intact x 4 without asymmetry.  Deep Tendon Reflexes:  Cerebellar: No ataxia with FNF bilaterally Gait: Deferred  Lab Results: Results for orders placed or performed during the hospital encounter of 06/18/23 (from the past 48 hour(s))  Ethanol     Status: None   Collection Time: 06/18/23  4:36 PM  Result Value Ref Range   Alcohol, Ethyl (B) <10 <10 mg/dL    Comment: (NOTE) Lowest detectable limit for serum alcohol  is 10 mg/dL.  For medical purposes only. Performed at [facility], [address], [place], [place] [address]   Protime-INR     Status: None   Collection Time: 06/18/23  4:37 PM  Result Value Ref Range   Prothrombin Time 14.8 11.4 - 15.2 seconds   INR 1.1 0.8 - 1.2    Comment: (NOTE) INR goal varies based on device and disease states. Performed at [facility], [address], [place], [place] [address]   APTT     Status: None   Collection Time: 06/18/23  4:37 PM  Result Value Ref Range   aPTT 24 24 - 36 seconds    Comment: Performed at [facility], [address], [place], [place] [address]  CBC     Status: Abnormal   Collection Time: 06/18/23  4:37 PM  Result Value Ref Range   WBC 6.9 4.0 - 10.5 K/uL   RBC 4.47 3.87 - 5.11 MIL/uL   Hemoglobin 8.6 (L) 12.0 - 15.0 g/dL    Comment: Reticulocyte Hemoglobin testing may be clinically indicated, consider ordering this additional test [id number]    HCT 29.9 (L) 36.0 - 46.0 %   MCV 66.9 (L) 80.0 - 100.0 fL   MCH 19.2 (L) 26.0 - 34.0 pg   MCHC 28.8 (L) 30.0 - 36.0 g/dL   RDW 62.1 (H) 30.8 - 65.7 %  Platelets 381 150 - 400 K/uL    Comment: REPEATED TO VERIFY   nRBC 0.0 0.0 - 0.2 %    Comment: Performed at [facility], [address], [place], [place] [address]  Differential     Status: None   Collection Time: 06/18/23  4:37 PM  Result Value Ref Range   Neutrophils Relative % 49 %   Neutro Abs 3.4 1.7 - 7.7 K/uL   Lymphocytes Relative 40 %   Lymphs Abs 2.8 0.7 - 4.0 K/uL   Monocytes Relative 7 %   Monocytes Absolute 0.5 0.1 - 1.0 K/uL   Eosinophils Relative 3 %   Eosinophils Absolute 0.2 0.0 - 0.5 K/uL   Basophils Relative 1 %   Basophils Absolute 0.1 0.0 - 0.1 K/uL   Immature Granulocytes 0 %   Abs Immature Granulocytes 0.01 0.00 - 0.07 K/uL    Comment: Performed at [facility], [address], [place], [place] [address]  Comprehensive metabolic panel     Status: Abnormal   Collection  Time: 06/18/23  4:37 PM  Result Value Ref Range   Sodium 138 135 - 145 mmol/L   Potassium 3.2 (L) 3.5 - 5.1 mmol/L   Chloride 105 98 - 111 mmol/L   CO2 23 22 - 32 mmol/L   Glucose, Bld 100 (H) 70 - 99 mg/dL    Comment: Glucose reference range applies only to samples taken after fasting for at least 8 hours.   BUN 5 (L) 6 - 20 mg/dL   Creatinine, Ser 2.95 0.44 - 1.00 mg/dL   Calcium 9.1 8.9 - 62.1 mg/dL   Total Protein 7.1 6.5 - 8.1 g/dL   Albumin 3.7 3.5 - 5.0 g/dL   AST 17 15 - 41 U/L   ALT 16 0 - 44 U/L   Alkaline Phosphatase 80 38 - 126 U/L   Total Bilirubin 0.5 0.3 - 1.2 mg/dL   GFR, Estimated >30 >86 mL/min    Comment: (NOTE) Calculated using the CKD-EPI Creatinine Equation (2021)    Anion gap 10 5 - 15    Comment: Performed at [facility], [address] [place], [place] [address]  hCG, serum, qualitative     Status: None   Collection Time: 06/18/23  4:37 PM  Result Value Ref Range   Preg, Serum NEGATIVE NEGATIVE    Comment:        THE SENSITIVITY OF THIS METHODOLOGY IS >10 mIU/mL. Performed at [facility], [address], [place], [place] [address]   CBG monitoring, ED     Status: None   Collection Time: 06/18/23  4:39 PM  Result Value Ref Range   Glucose-Capillary 99 70 - 99 mg/dL    Comment: Glucose reference range applies only to samples taken after fasting for at least 8 hours.  I-stat chem 8, ED     Status: Abnormal   Collection Time: 06/18/23  4:42 PM  Result Value Ref Range   Sodium 139 135 - 145 mmol/L   Potassium 3.3 (L) 3.5 - 5.1 mmol/L   Chloride 104 98 - 111 mmol/L   BUN 3 (L) 6 - 20 mg/dL   Creatinine, Ser 2.84 0.44 - 1.00 mg/dL   Glucose, Bld 95 70 - 99 mg/dL    Comment: Glucose reference range applies only to samples taken after fasting for at least 8 hours.   Calcium, Ion 1.12 (L) 1.15 - 1.40 mmol/L   TCO2 22 22 - 32  mmol/L   Hemoglobin 10.2 (L) 12.0 - 15.0 g/dL   HCT 74.2 (L) 59.5 - 63.8 %  CBC     Status: Abnormal    Collection Time: 06/19/23  6:35 AM  Result Value Ref Range   WBC 6.7 4.0 - 10.5 K/uL   RBC 4.37 3.87 - 5.11 MIL/uL   Hemoglobin 8.5 (L) 12.0 - 15.0 g/dL    Comment: Reticulocyte Hemoglobin testing may be clinically indicated, consider ordering this additional test [id number]    HCT 29.2 (L) 36.0 - 46.0 %   MCV 66.8 (L) 80.0 - 100.0 fL   MCH 19.5 (L) 26.0 - 34.0 pg   MCHC 29.1 (L) 30.0 - 36.0 g/dL   RDW 51.8 (H) 84.1 - 66.0 %   Platelets 335 150 - 400 K/uL    Comment: REPEATED TO VERIFY   nRBC 0.0 0.0 - 0.2 %    Comment: Performed at [facility], [address] [place], [place] [address]  Ferritin     Status: Abnormal   Collection Time: 06/19/23  6:35 AM  Result Value Ref Range   Ferritin 1 (L) 11 - 307 ng/mL    Comment: Performed at [facility], [address] [place], [place] [address]  Iron and TIBC     Status: Abnormal   Collection Time: 06/19/23  6:35 AM  Result Value Ref Range   Iron 11 (L) 28 - 170 ug/dL   TIBC 235 (H) 573 - 220 ug/dL   Saturation Ratios 2 (L) 10.4 - 31.8 %   UIBC 455 ug/dL    Comment: Performed at [facility], [address] [place], [place] [address]  HIV Antibody (routine testing w rflx)     Status: None   Collection Time: 06/19/23  6:35 AM  Result Value Ref Range   HIV Screen 4th Generation wRfx Non Reactive Non Reactive    Comment: Performed at [facility], [address] [place], [place] [address]  Lipid panel     Status: Abnormal   Collection Time: 06/19/23  6:35 AM  Result Value Ref Range   Cholesterol 155 0 - 200 mg/dL   Triglycerides 628 <315 mg/dL   HDL 36 (L) >17 mg/dL   Total CHOL/HDL Ratio 4.3 RATIO   VLDL 26 0 - 40 mg/dL   LDL Cholesterol 93 0 - 99 mg/dL    Comment:        Total Cholesterol/HDL:CHD Risk Coronary Heart Disease Risk Table                     Men   Women  1/2 Average Risk   3.4   3.3  Average Risk       5.0   4.4  2 X Average Risk   9.6   7.1  3 X Average Risk  23.4   11.0         Use the calculated Patient Ratio above and the CHD Risk Table to determine the patient's CHD Risk.        ATP III CLASSIFICATION (LDL):  <100     mg/dL   Optimal  616-073  mg/dL   Near or Above                    Optimal  130-159  mg/dL   Borderline  710-626  mg/dL   High  >948     mg/dL   Very High Performed at [facility], [address] [place], [place] [address]   TSH     Status: None   Collection Time: 06/19/23  6:35 AM  Result Value Ref Range   TSH 3.490 0.350 - 4.500 uIU/mL    Comment: Performed by a 3rd Generation assay with a functional sensitivity of <=0.01 uIU/mL. Performed at [facility], [address] [place], [place] [address]     No results found for this or any previous visit (from the past 240 hour(s)).  Lipid Panel Recent Labs    06/19/23 0635  CHOL 155  TRIG 130  HDL 36*  CHOLHDL 4.3  VLDL 26  LDLCALC 93    Studies/Results: MR Brain Wo Contrast (neuro protocol)  Result Date: 06/18/2023 CLINICAL DATA:  Stroke suspected, difficulty with right arm and leg EXAM: MRI HEAD WITHOUT CONTRAST TECHNIQUE: Multiplanar, multiecho pulse sequences of the brain and surrounding structures were obtained without intravenous contrast. COMPARISON:  No prior head MRI available, correlation is made with CT head 06/18/2023 FINDINGS: Evaluation is limited as the patient was not able to complete the exam. Only diffusion-weighted axial and coronal sequences, axial T2 weighted sequence, and sagittal T1 weighted sequence was obtained. Brain: No restricted diffusion to suggest acute or subacute infarct. No acute hemorrhage, mass, mass effect, or midline shift. No hydrocephalus or extra-axial collection. No evidence of remote cortical or lacunar infarct on the axial T2 or sagittal T1 weighted sequences. The suspected prior right cerebellar infarct noted on the same day CT head may have been artifactual. Vascular: Normal arterial flow voids. Skull and upper cervical  spine: Normal marrow signal. Right frontal osteoma. Sinuses/Orbits: Air-fluid level in the right maxillary sinus, with mucosal thickening in the right greater than left ethmoid air cells and right frontal sinus. No acute finding in the orbits. Other: The mastoid air cells are well aerated. IMPRESSION: 1. Evaluation is limited as the patient was not able to complete the exam. Within this limitation, there is no acute intracranial process. No evidence of acute or subacute infarct. 2. Air-fluid level in the right maxillary sinus, which can be seen in the setting of acute sinusitis. Electronically Signed   By: Wiliam Ke M.D.   On: 06/18/2023 21:16   CT ANGIO HEAD NECK W WO CM (CODE STROKE)  Result Date: 06/18/2023 CLINICAL DATA:  Right extremity weakness. EXAM: CT ANGIOGRAPHY HEAD AND NECK WITH AND WITHOUT CONTRAST TECHNIQUE: Multidetector CT imaging of the head and neck was performed using the standard protocol during bolus administration of intravenous contrast. Multiplanar CT image reconstructions and MIPs were obtained to evaluate the vascular anatomy. Carotid stenosis measurements (when applicable) are obtained utilizing NASCET criteria, using the distal internal carotid diameter as the denominator. RADIATION DOSE REDUCTION: This exam was performed according to the departmental dose-optimization program which includes automated exposure control, adjustment of the mA and/or kV according to patient size and/or use of iterative reconstruction technique. CONTRAST:  75mL OMNIPAQUE IOHEXOL 350 MG/ML SOLN COMPARISON:  Same-day noncontrast CT head FINDINGS: CTA NECK FINDINGS Aortic arch: The imaged aortic arch is normal. The origins of the major branch vessels are patent. The subclavian arteries are patent to the level imaged. Right carotid system: The right common, internal, and external carotid arteries are patent, without hemodynamically significant stenosis or occlusion. There is no evidence of dissection or  aneurysm. Left carotid system: Left common, internal, and external carotid arteries are patent, without hemodynamically significant stenosis or occlusion there is no evidence of dissection or aneurysm. Vertebral  arteries: Vertebral arteries are patent, without hemodynamically significant stenosis or occlusion there is no evidence of dissection or aneurysm. Skeleton: There is no acute osseous abnormality or suspicious osseous lesion. There is no visible canal hematoma. Other neck: The soft tissues of the neck are unremarkable. Upper chest: The imaged lung apices are clear. Review of the MIP images confirms the above findings CTA HEAD FINDINGS Anterior circulation: The intracranial ICAs are normal. The bilateral MCAs and ACAS are normal. The anterior communicating artery is normal. There is no aneurysm or AVM. Posterior circulation: The bilateral V4 segments are normal. The basilar artery is normal. The major cerebellar arteries appear normal. The bilateral PCAs are normal. A right posterior communicating artery is identified. There is no aneurysm or AVM. Venous sinuses: Patent. Anatomic variants: None. Review of the MIP images confirms the above findings IMPRESSION: Normal CTA of the head neck. Electronically Signed   By: Lesia Hausen M.D.   On: 06/18/2023 17:49   CT HEAD CODE STROKE WO CONTRAST  Result Date: 06/18/2023 CLINICAL DATA:  Code stroke. Left-sided facial droop. Right-sided extremity weakness. EXAM: CT HEAD WITHOUT CONTRAST TECHNIQUE: Contiguous axial images were obtained from the base of the skull through the vertex without intravenous contrast. RADIATION DOSE REDUCTION: This exam was performed according to the departmental dose-optimization program which includes automated exposure control, adjustment of the mA and/or kV according to patient size and/or use of iterative reconstruction technique. COMPARISON:  CT head without contrast 03/03/2023. FINDINGS: Brain: No acute infarct, hemorrhage, or mass  lesion is present. No significant white matter lesions are present. Deep brain nuclei are within normal limits. Insular ribbon is normal bilaterally. The ventricles are of normal size. No significant extraaxial fluid collection is present. In inferior right cerebellar nonhemorrhagic infarct is better visualized than on the prior study. Portions of this may have been present previously. Acute progression is not excluded. The brainstem and left cerebellum are within normal limits. Vascular: No hyperdense vessel or unexpected calcification. Skull: A right frontal scalp osteoma is stable. Calvarium is otherwise unremarkable. Soft scalp is within normal limits. Sinuses/Orbits: Fluid is present in the right maxillary sinus and right frontal sinus. Scattered anterior ethmoid air cells are opacified on the right. The paranasal sinuses and mastoid air cells are otherwise clear. ASPECTS [facility] Stroke Program Early CT Score) - Ganglionic level infarction (caudate, lentiform nuclei, internal capsule, insula, M1-M3 cortex): 7/7 - Supraganglionic infarction (M4-M6 cortex): 3/3 Total score (0-10 with 10 being normal): 10/10 IMPRESSION: 1. 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 Portions of this may have been present previously. Acute progression is not excluded. Recommend MRI for further evaluation. 2. Normal appearance of the supratentorial brain. 3. Aspects is 10/10. The above was relayed via text pager to Dr. Caryl Pina on 06/18/2023 at 16:54. Electronically Signed   By: Marin Roberts M.D.   On: 06/18/2023 16:55    Medications: Scheduled:  hydrochlorothiazide  25 mg Oral Daily   nicotine  14 mg Transdermal Daily    Assessment: 37 year old female with acute onset of right sided weakness in conjunction with severe pulsating headache with photophobia - Neurology exam today is significantly improved with minimal right sided weakness. The patient states that  she feels almost at baseline and is looking forward to discharge this evening.   - CT head: Inferior right cerebellar hypodensity may be artifactual. Normal appearance of the supratentorial brain. Aspects is 10/10. - CTA of head and neck: Normal - MRI  brain: No acute stroke. Air-fluid level is present in the right maxillary sinus, which can be seen in the setting of acute sinusitis.  - Overall impression:  - Complicated migraine. Headache is resolved. Right sided weakness significantly improved and almost back to baseline.  - She has an approximately 5 year history of 3-4 headaches per month that tend to last for about 2 days each and are associated with photophobia, sonophobia, throbbing head pain, nausea/vomiting at times. She has not had any prior weakness accompanying her headaches. She has no formal diagnosis of migraines, but history provided by patient is most consistent with such. She has not yet been able to be evaluated outpatient due to insurance issues.    Recommendations: - Agree with starting patient on migraine-prophylactic dose of Verapamil 120 mg BID which will also be synergistic in management of her HTN.  - Outpatient Neurology follow up for management of her migraine headaches.    LOS: 0 days   @Electronically  signed: Dr. Caryl Pina 06/19/2023  5:30 PM

## 2023-06-19 NOTE — Assessment & Plan Note (Addendum)
No history of chronic HA or migraines, however she was seen in ED March 2024 for HA.  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 - Discussed the risks and benefits of verapamil as a prophylactic measure for her migraines for about 6 months and this would also benefit her blood pressure. - If Verapamil is added along with ferrous sulfate supplementation, consider adding on Miralax to her regimen to avoid constipation

## 2023-06-19 NOTE — TOC CM/SW Note (Signed)
Transition of Care [facility]) - Inpatient Brief Assessment   Patient Details  Name: Brittany Graves MRN: [id number] Date of Birth: August 27, 1986  [facility] CM/SW Contact:    Kermit Balo, RN Phone Number: 06/19/2023, 3:50 PM   Clinical Narrative:  [facility] to pick her up in clinic for PCP.  Awaiting therapy evals.   Transition of Care Asessment: Insurance and Status: Insurance coverage has been reviewed Patient has primary care physician: No Home environment has been reviewed: home with spouse   Prior/Current Home Services: No current home services Social Determinants of Health Reivew: SDOH reviewed no interventions necessary Readmission risk has been reviewed: Yes Transition of care needs: transition of care needs identified, TOC will continue to follow

## 2023-06-19 NOTE — Assessment & Plan Note (Addendum)
Suspected stroke, but not apparent on MRI. Also considering complex migraine/hemiplegic migraine vs  cervical and lumbar radiculopathy due to R sided neck/trap pain and low back pain or TIA especially given risk factors (long-time smoker and HTN) vs. Weakness in the setting of severe iron deficiency anemia. Patient reports that her headache has improved today. - Neuro recommended ASA 81 mg PO daily, but we will hold off for now in the setting of severe iron deficiency anemia. - Neuro on board, appreciate recs

## 2023-06-19 NOTE — Assessment & Plan Note (Addendum)
Patient has been out of her BP medication for at least 2 weeks. - Monitor BP - Restarted Hydrochlorothiazide 25 mg - Modify/resume oral antihypertensive as appropriate

## 2023-06-19 NOTE — Discharge Instructions (Addendum)
Dear Brittany Graves,  Thank you for letting us participate in your care. You were hospitalized and diagnosed with Right-sided Weakness and had concern for a stroke. Your CT and MRI ruled out stroke at this time. We recommend limiting your risk factors, such as stop drinking and take your anti-hypertensive medications to help control your BP to prevent stroke.  Your headaches may be associated to having migraines, we discussed this with neurology and recommend starting verapamil to help prevent you from having further episodes like this in the future. You should continue taking oral iron supplementation. With this new medication to prevent headaches it can sometimes cause constipation, you should take miralax and sena to make sure you are having a daily bowel movement.   POST-HOSPITAL & CARE INSTRUCTIONS Go to your follow up appointments (listed below)   DOCTOR'S APPOINTMENT   Future Appointments  Date Time Provider Department Center  07/09/2023  1:45 PM [name], DO [facility]    Follow-up Information     [name], DO. Go on 07/09/2023.   Why: You have an appointment with Dr. Fatima Blank on Thursday, August 1st at 1:45pm, please arrive 15 minutes early. Contact information: [address] [place] [address] [phone]                 Take care and be well!  Family Medicine Teaching Service Inpatient Team [place]  [facility]  [address] [place], [place] [address] [phone]      [name],  You have an appointment with Dr. Fatima Blank on Thursday, August 1st at 1:45pm, please arrive 15 minutes early.

## 2023-06-19 NOTE — Assessment & Plan Note (Signed)
Patient is a long time smoker, increasing her risk for vascular disease.  He smokes 1 pack/day and has smoked for quite some time. - Discussed nicotine cessation and started patient on a nicotine patch.

## 2023-06-19 NOTE — Assessment & Plan Note (Addendum)
Severely microcytic, MCV 66, Hgb 8.6 on admission. Mentzer index 14. Iron 11, TIBC 466, Ferritin 1. Causes for IDA could be abnormal uterine bleeding, inability to absorb iron, vs. lack of iron in diet. Her weakness could also be in the setting of her severe iron deficiency anemia.  - IV Ferric gluconate 250 mg was given but will be switched to iron sucrose 500 mg (Venofer) - Continue iron supplementation outpatient (Ferrous sulfate 325 mg) - Monitor CBC

## 2023-06-19 NOTE — Progress Notes (Addendum)
Daily Progress Note Intern Pager: [phone]  Patient name: GRACEANNE PRIMACK Medical record number: [id number] Date of birth: 1986/03/03 Age: 37 y.o. Gender: female  Primary Care Provider: Patient, No Pcp Per Consultants: Neurology Code Status: Full Code  Pt Overview and Major Events to Date:  7/11: Admitted d/t stroke-like symptoms  Assessment and Plan: SHELBI JUSZCZAK is a 37 y.o. 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  -      Hospital     * (Principal) Weakness     Suspected stroke, but not apparent on MRI. Also considering complex  migraine/hemiplegic migraine vs  cervical and lumbar radiculopathy due to  R sided neck/trap pain and low back pain or TIA especially given risk  factors (long-time smoker and HTN) vs. Weakness in the setting of severe  iron deficiency anemia. Patient reports that her headache has improved  today. - Neuro recommended ASA 81 mg PO daily, but we will hold off for now in  the setting of severe iron deficiency anemia. - Neuro on board, appreciate recs        HTN (hypertension)     Patient has been out of her BP medication for at least 2 weeks. - Monitor BP - Restarted Hydrochlorothiazide 25 mg - Modify/resume oral antihypertensive as appropriate        Headache     No history of chronic HA or migraines, however she was seen in ED March  2024 for HA.  Due to the duration of her headache being about 48 hours,  being pulsatile in nature, associated with nausea at times, and causing  right-sided weakness, there is concern for complex migraine vs hemiplegic  migraine. - Discussed the risks and benefits of verapamil as a prophylactic measure  for her migraines for about 6 months and this would also benefit her blood  pressure. - If Verapamil is added along with ferrous sulfate supplementation,  consider adding on Miralax to her  regimen to avoid constipation          Does not have primary care provider     - Dr. Fatima Blank is willing to be her PCP        Iron deficiency anemia     Severely microcytic, MCV 66, Hgb 8.6 on admission. Mentzer index 14.  Iron 11, TIBC 466, Ferritin 1. Causes for IDA could be abnormal uterine  bleeding, inability to absorb iron, vs. lack of iron in diet. Her weakness  could also be in the setting of her severe iron deficiency anemia.  - IV Ferric gluconate 250 mg was given but will be switched to iron  sucrose 500 mg (Venofer) - Continue iron supplementation outpatient (Ferrous sulfate 325 mg) - Monitor CBC        Smoking     Patient is a long time smoker, increasing her risk for vascular  disease.  He smokes 1 pack/day and has smoked for quite some time. - Discussed nicotine cessation and started patient on a nicotine patch.      FEN/GI: Regular diet PPx: Lovenox Dispo: home pending IV Ferrlecit  Subjective:  Patient is doing well this morning.  Headache has improved since yesterday.  She reports that she has some right sided neck versus trap pain that sometimes causes some numbness/tingling down her arm along with some back pain that causes numbness/tingling down her right leg.  She has not been  compliant with her anti-hypertensive meds due to not having a PCP who can prescribe them.  Objective: Temp:  [97.7 F (36.5 C)-99.6 F (37.6 C)] 98.5 F (36.9 C) (07/12 0821) Pulse Rate:  [77-118] 87 (07/12 0821) Resp:  [16-23] 18 (07/12 0821) BP: (139-181)/(88-136) 139/88 (07/12 0821) SpO2:  [97 %-100 %] 100 % (07/12 0821) Weight:  [94.7 kg-106.6 kg] 94.7 kg (07/11 2344)  Physical Exam: General: NAD. Awake and alert. Cardiovascular: RRR. No M/R/G. Respiratory: CTAB. Normal WOB on RA.  Abdomen: soft, nontender, nondistended, bowel sounds present Extremities: no BLE edema, 3/5 R UE and LE weakness vs. 4/5 L UE and LE strength, able to walk on her own with slight instability of  her tip toes and heels.  Laboratory: Most recent CBC Lab Results  Component Value Date   WBC 6.7 06/19/2023   HGB 8.5 (L) 06/19/2023   HCT 29.2 (L) 06/19/2023   MCV 66.8 (L) 06/19/2023   PLT 335 06/19/2023   Most recent BMP    Latest Ref Rng & Units 06/18/2023    4:42 PM  BMP  Glucose 70 - 99 mg/dL 95   BUN 6 - 20 mg/dL 3   Creatinine 1.61 - 0.96 mg/dL 0.45   Sodium 409 - 811 mmol/L 139   Potassium 3.5 - 5.1 mmol/L 3.3   Chloride 98 - 111 mmol/L 104    Iron 11 TIBC 466 Ferritin 1  Imaging/Diagnostic Tests: No new imaging.  [name], DO 06/19/2023, 11:42 AM  PGY-1, [facility] FPTS Intern pager: [phone], text pages welcome Secure chat group [facility] Teaching Service

## 2023-06-19 NOTE — Assessment & Plan Note (Addendum)
-   Dr. Fatima Blank is willing to be her PCP

## 2023-06-20 LAB — HEMOGLOBIN A1C
Hgb A1c MFr Bld: 5.5 % (ref 4.8–5.6)
Mean Plasma Glucose: 111 mg/dL

## 2023-06-22 ENCOUNTER — Telehealth: Payer: Medicaid Other | Admitting: Family Medicine

## 2023-06-22 DIAGNOSIS — M7989 Other specified soft tissue disorders: Secondary | ICD-10-CM

## 2023-06-22 NOTE — Progress Notes (Signed)
Based on what you shared with me, I feel your condition warrants further evaluation as soon as possible at an Emergency department.    NOTE: There will be NO CHARGE for this eVisit

## 2023-06-23 ENCOUNTER — Encounter (HOSPITAL_COMMUNITY): Payer: Self-pay

## 2023-06-23 ENCOUNTER — Ambulatory Visit (HOSPITAL_COMMUNITY)
Admission: RE | Admit: 2023-06-23 | Discharge: 2023-06-23 | Disposition: A | Discharge status: Home / Self Care | Payer: Medicaid Other | Source: Ambulatory Visit | Attending: Nurse Practitioner | Admitting: Nurse Practitioner

## 2023-06-23 ENCOUNTER — Ambulatory Visit (HOSPITAL_COMMUNITY): Admit: 2023-06-23 | Payer: Medicaid Other

## 2023-06-23 VITALS — BP 125/87 | HR 101 | Temp 98.3°F | Resp 16

## 2023-06-23 DIAGNOSIS — I82611 Acute embolism and thrombosis of superficial veins of right upper extremity: Secondary | ICD-10-CM | POA: Diagnosis not present

## 2023-06-23 DIAGNOSIS — M7989 Other specified soft tissue disorders: Secondary | ICD-10-CM | POA: Diagnosis not present

## 2023-06-23 MED ORDER — IBUPROFEN 600 MG PO TABS: 600.0000 mg | ORAL_TABLET | Freq: Three times a day (TID) | ORAL | 0 refills | Status: AC

## 2023-06-23 MED ORDER — CEPHALEXIN 500 MG PO CAPS: 500.0000 mg | ORAL_CAPSULE | Freq: Three times a day (TID) | ORAL | 0 refills | Status: AC

## 2023-06-23 NOTE — Discharge Instructions (Addendum)
Your ultrasound indicates a superficial thrombus of the right arm. You have been prescribed Keflex 500 mg 1 tablet 3 times daily for 7 days.  You have also been prescribed Motrin 600 mg 3 times daily.  You can also use warm compresses to the area.  If your symptoms are not improving within the next week we encourage you to follow back up with urgent care.

## 2023-06-23 NOTE — ED Triage Notes (Signed)
Pt states pain and swelling to right AC where she had a IV.  States she has been putting cold compresses on it with no relief.

## 2023-06-23 NOTE — Progress Notes (Signed)
Right upper extremity venous study completed.   Preliminary results relayed to Thad Ranger, NP.  Please see CV Procedures for preliminary results.  Christene Lye, RVT  4:23 PM 06/23/23

## 2023-06-23 NOTE — ED Provider Notes (Signed)
[facility]    CSN: [id number] Arrival date & time: 06/23/23  1429      History   Chief Complaint Chief Complaint  Patient presents with   Wound Check    HPI Brittany Graves is a 37 y.o. female.   HPI   She is in today for evaluation of her right arm.  She was recently admitted to the hospital for strokelike symptoms on 06/18/2023 and discharged on 06/19/2023.  She reports that she was receiving an iron infusion and did make the staff aware that she was having some pain and swelling at the site.  The infusion was stopped and the IV was later removed.  She reports that this morning she woke up with redness and increased swelling to the area.  She has used cold compresses without relief.  She is concerned that she may have an infection.  She denies any fever, chills, shortness of breath or chest pains.  Past Medical History:  Diagnosis Date   Hypertension     Patient Active Problem List   Diagnosis Date Noted   Iron deficiency anemia 06/19/2023   Smoking 06/19/2023   HTN (hypertension) 06/18/2023   Headache 06/18/2023   Stroke (HCC) 06/18/2023   Does not have primary care provider 06/18/2023   Weakness 06/18/2023    Past Surgical History:  Procedure Laterality Date   TUBAL LIGATION      OB History   No obstetric history on file.      Home Medications    Prior to Admission medications   Medication Sig Start Date End Date Taking? Authorizing Provider  cephALEXin (KEFLEX) 500 MG capsule Take 1 capsule (500 mg total) by mouth 3 (three) times daily for 7 days. 06/23/23 06/30/23 Yes Barbette Merino, NP  ibuprofen (ADVIL) 600 MG tablet Take 1 tablet (600 mg total) by mouth 3 (three) times daily for 7 days. 06/23/23 06/30/23 Yes Barbette Merino, NP  ferrous sulfate 325 (65 FE) MG tablet Take 1 tablet (325 mg total) by mouth daily. 03/23/23   Gwyneth Sprout, MD  nicotine (NICODERM CQ - DOSED IN MG/24 HOURS) 14 mg/24hr patch Place 1 patch (14 mg total) onto the  skin daily. 06/20/23   Glendale Chard, DO  polyethylene glycol powder (GLYCOLAX/MIRALAX) 17 GM/SCOOP powder Take 17 g by mouth daily. 06/19/23   Glendale Chard, DO  potassium chloride (KLOR-CON) 10 MEQ tablet Take 2 tablets (20 mEq total) by mouth daily. Patient taking differently: Take 10 mEq by mouth daily. 03/03/23   Prosperi, Christian H, PA-C  senna-docusate (SENOKOT-S) 8.6-50 MG tablet Take 1 tablet by mouth daily. 06/19/23   Glendale Chard, DO  verapamil (CALAN-SR) 120 MG CR tablet Take 1 tablet (120 mg total) by mouth 2 (two) times daily. 06/19/23 07/19/23  Glendale Chard, DO    Family History Family History  Problem Relation Age of Onset   Hypertension Mother    Diabetes Mother     Social History Social History   Tobacco Use   Smoking status: Some Days   Smokeless tobacco: Never  Substance Use Topics   Alcohol use: No     Allergies   Patient has no known allergies.   Review of Systems Review of Systems   Physical Exam Triage Vital Signs ED Triage Vitals  Encounter Vitals Group     BP 06/23/23 1459 125/87     Systolic BP Percentile --      Diastolic BP Percentile --      Pulse Rate  06/23/23 1459 (!) 101     Resp 06/23/23 1459 16     Temp 06/23/23 1459 98.3 F (36.8 C)     Temp Source 06/23/23 1459 Oral     SpO2 06/23/23 1459 98 %     Weight --      Height --      Head Circumference --      Peak Flow --      Pain Score 06/23/23 1501 9     Pain Loc --      Pain Education --      Exclude from Growth Chart --    No data found.  Updated Vital Signs BP 125/87 (BP Location: Left Arm)   Pulse (!) 101   Temp 98.3 F (36.8 C) (Oral)   Resp 16   LMP 06/09/2023 (Approximate)   SpO2 98%   Visual Acuity Right Eye Distance:   Left Eye Distance:   Bilateral Distance:    Right Eye Near:   Left Eye Near:    Bilateral Near:     Physical Exam Constitutional:      General: She is not in acute distress.    Appearance: She is obese.  HENT:     Head:  Normocephalic and atraumatic.  Skin:    General: Skin is warm.     Findings: Erythema present.          Comments: Mild swelling around axillary region medial and lateral.  Neurological:     Mental Status: She is alert.      UC Treatments / Results  Labs (all labs ordered are listed, but only abnormal results are displayed) Labs Reviewed - No data to display  EKG   Radiology UE Venous Duplex ([facility] ONLY)  Result Date: 06/23/2023 UPPER VENOUS STUDY  Patient Name:  Brittany Graves  Date of Exam:   06/23/2023 Medical Rec #: [id number]        Accession #:    [id number] Date of Birth: [birth year]       Patient Gender: F Patient Age:   32 years Exam Location:  [facility] Procedure:      VAS [place] UPPER EXTREMITY VENOUS DUPLEX Referring Phys: Thad Ranger --------------------------------------------------------------------------------  Indications: recent IV, pain and swelling at ac fossa Comparison Study: No previous study. Performing Technologist: McKayla Maag RVT, [place]  Examination Guidelines: A complete evaluation includes B-mode imaging, spectral Doppler, color Doppler, and power Doppler as needed of all accessible portions of each vessel. Bilateral testing is considered an integral part of a complete examination. Limited examinations for reoccurring indications may be performed as noted.  Right Findings: +----------+------------+---------+-----------+----------+-------+ RIGHT     CompressiblePhasicitySpontaneousPropertiesSummary +----------+------------+---------+-----------+----------+-------+ IJV           Full       Yes       Yes                      +----------+------------+---------+-----------+----------+-------+ Subclavian    Full       Yes       Yes                      +----------+------------+---------+-----------+----------+-------+ Axillary      Full       Yes       Yes                       +----------+------------+---------+-----------+----------+-------+ Brachial      Full  Yes       Yes                      +----------+------------+---------+-----------+----------+-------+ Radial        Full                                          +----------+------------+---------+-----------+----------+-------+ Ulnar         Full                                          +----------+------------+---------+-----------+----------+-------+ Cephalic    Partial      No        No                Acute  +----------+------------+---------+-----------+----------+-------+ Basilic       Full                                          +----------+------------+---------+-----------+----------+-------+  Left Findings: +----------+------------+---------+-----------+----------+-------+ LEFT      CompressiblePhasicitySpontaneousPropertiesSummary +----------+------------+---------+-----------+----------+-------+ Subclavian    Full       Yes       Yes                      +----------+------------+---------+-----------+----------+-------+  Summary:  Right: No evidence of deep vein thrombosis in the upper extremity. Findings consistent with acute superficial vein thrombosis involving the right cephalic vein, AC fossa to proximal forearm.  Left: No evidence of thrombosis in the subclavian.  *See table(s) above for measurements and observations.  Diagnosing physician: Waverly Ferrari MD Electronically signed by Waverly Ferrari MD on 06/23/2023 at 4:51:18 PM.    Final     Procedures Procedures (including critical care time)  Medications Ordered in UC Medications - No data to display  Initial Impression / Assessment and Plan / UC Course  I have reviewed the triage vital signs and the nursing notes.  Pertinent labs & imaging results that were available during my care of the patient were reviewed by me and considered in my medical decision making (see chart for  details).     Arm pain Final Clinical Impressions(s) / UC Diagnoses   Final diagnoses:  Acute embolism and thrombosis of superficial vein of right upper extremity     Discharge Instructions      Your ultrasound indicates a superficial thrombus of the right arm. You have been prescribed Keflex 500 mg 1 tablet 3 times daily for 7 days.  You have also been prescribed Motrin 600 mg 3 times daily.  You can also use warm compresses to the area.  If your symptoms are not improving within the next week we encourage you to follow back up with urgent care.     ED Prescriptions     Medication Sig Dispense Auth. Provider   cephALEXin (KEFLEX) 500 MG capsule Take 1 capsule (500 mg total) by mouth 3 (three) times daily for 7 days. 21 capsule Thad Ranger M, NP   ibuprofen (ADVIL) 600 MG tablet Take 1 tablet (600 mg total) by mouth 3 (three) times daily for 7 days. 21 tablet Barbette Merino, NP      PDMP  not reviewed this encounter.   Thad Ranger [place], [place] 06/23/23 [phone]

## 2023-07-09 ENCOUNTER — Other Ambulatory Visit (HOSPITAL_COMMUNITY): Payer: Self-pay

## 2023-07-09 ENCOUNTER — Encounter: Payer: Self-pay | Admitting: Family Medicine

## 2023-07-09 ENCOUNTER — Ambulatory Visit: Payer: Medicaid Other | Admitting: Family Medicine

## 2023-07-09 VITALS — BP 134/88 | HR 88 | Ht 64.0 in | Wt 223.0 lb

## 2023-07-09 DIAGNOSIS — I10 Essential (primary) hypertension: Secondary | ICD-10-CM | POA: Diagnosis not present

## 2023-07-09 DIAGNOSIS — Z716 Tobacco abuse counseling: Secondary | ICD-10-CM | POA: Diagnosis not present

## 2023-07-09 DIAGNOSIS — N920 Excessive and frequent menstruation with regular cycle: Secondary | ICD-10-CM

## 2023-07-09 DIAGNOSIS — D509 Iron deficiency anemia, unspecified: Secondary | ICD-10-CM | POA: Diagnosis not present

## 2023-07-09 DIAGNOSIS — Z566 Other physical and mental strain related to work: Secondary | ICD-10-CM

## 2023-07-09 MED ORDER — FERROUS SULFATE 324 MG PO TBEC
324.0000 mg | DELAYED_RELEASE_TABLET | Freq: Every day | ORAL | 3 refills | Status: DC
  Filled 2023-07-09: qty 100, 100d supply, fill #0

## 2023-07-09 MED ORDER — NICOTINE 21 MG/24HR TD PT24
21.0000 mg | MEDICATED_PATCH | Freq: Every day | TRANSDERMAL | 0 refills | Status: DC
Start: 2023-07-09 — End: 2024-03-21
  Filled 2023-07-09 – 2023-07-21 (×2): qty 28, 28d supply, fill #0

## 2023-07-09 MED ORDER — LOSARTAN POTASSIUM 50 MG PO TABS
50.0000 mg | ORAL_TABLET | Freq: Every day | ORAL | 3 refills | Status: DC
Start: 2023-07-09 — End: 2023-07-09

## 2023-07-09 MED ORDER — NICOTINE 21 MG/24HR TD PT24: 21.0000 mg | MEDICATED_PATCH | Freq: Every day | TRANSDERMAL | 0 refills | Status: DC

## 2023-07-09 MED ORDER — LOSARTAN POTASSIUM 50 MG PO TABS
50.0000 mg | ORAL_TABLET | Freq: Every day | ORAL | 3 refills | Status: DC
Start: 2023-07-09 — End: 2023-12-21
  Filled 2023-07-09 – 2023-07-21 (×3): qty 90, 90d supply, fill #0
  Filled 2023-10-14 – 2023-12-18 (×2): qty 90, 90d supply, fill #1

## 2023-07-09 NOTE — Assessment & Plan Note (Addendum)
Elevated blood pressures upon admission in the hospital.  She had 2 elevated blood pressures here in the office today upon repeat. - Started Losartan 50 mg daily, patient is not currently pregnant and does not plan to become pregnant, discussed with her that if she does plan to become pregnant she should discontinue this medication immediately as it could impact her pregnancy. - BMP check at her visit next Friday - Will continue Verapamil 120 mg twice daily for migraine prophylaxis and HTN

## 2023-07-09 NOTE — Progress Notes (Addendum)
SUBJECTIVE:   CHIEF COMPLAINT / HPI: Hospital follow-up  Patient comes in today for hospital follow-up.  She is stressed and feeling a little depressed due to work issues and being out of work currently, this is impacting her personal life and her ability to take care of her children and have a stable place to live.  She inquired about getting a work note today.    Patient states her symptoms from the hospital have improved as well as the thrombophlebitis she went to the urgent care for.  She has not been taking her iron supplementation or her Keflex prescribed at urgent care.    Concerned about her menstrual cycle being heavy with large clots.  Reports having 1 cycle at the beginning of this month followed by 1 recently.  During her hospitalization her hemoglobin was 8.5, ferritin 1, iron 11, TIBC 466.  She received an iron infusion in the hospital, but has not been taking her iron supplementation.  We discussed how her smoking cessation has been going, she says she wears her nicotine patch 4 out of 7 days of the week, however she smokes on the days she does not wear her patch.  She inquired about getting a stronger patch today.  Has continued to take her verapamil 120 mg twice daily brain prophylaxis as well as to help her blood pressure.  However her blood pressure still remains high at this visit.  PERTINENT  PMH / PSH: HTN, tobacco use, hidradenitis suppurativa  OBJECTIVE:   BP 134/88   Pulse 88   Ht 5\' 4"  (1.626 m)   Wt 223 lb (101.2 kg)   LMP 07/01/2023 (Exact Date)   SpO2 100%   BMI 38.28 kg/m   General: NAD, awake and alert Cardiovascular: RRR. No M/R/G Respiratory: CTAB, normal WOB on RA. No wheezing, crackles, or rhonchi. Abdomen: soft, non-tender, non-distended. Bowel sounds normoactive Extremities: no BLE edema Neuro: A&Ox3. No focal neurological deficits.  CN II: PERRL CN III, IV,VI: EOMI CV V: Normal sensation in V1, V2, V3 CVII: Symmetric smile and brow  raise CN VIII: Normal hearing CN XI: 5/5 shoulder shrug UE and LE strength 5/5 Normal sensation in UE and LE bilaterally   ASSESSMENT/PLAN:   Iron deficiency anemia She has not been taking her iron supplements because she had occultly affording it over-the-counter.  Also reports that her menstrual cycles have been heavy with clots and occurred twice this month. - CBC recheck - Re-prescribed ferrous sulfate 324 mg daily today and send it to outpatient [place] pharmacy to see if that would be more affordable.  Menorrhagia Patient reports that her menstrual cycles have been quite heavy and she had 1 at the beginning of this month and restarted her cycle at the end of this month.  We discussed monitoring her cycle with how many pads she goes through and the presence of clots. - Will consider Provera if hemoglobin is low and if her menstrual cycles continue to be heavy - Will discuss contraceptive options versus hysterectomy at her next visit to help improve her menstrual cycles at her next visit  HTN (hypertension) Elevated blood pressures upon admission in the hospital.  She had 2 elevated blood pressures here in the office today upon repeat. - Started Losartan 50 mg daily, patient is not currently pregnant and does not plan to become pregnant, discussed with her that if she does plan to become pregnant she should discontinue this medication immediately as it could impact her pregnancy. -  BMP check at her visit next Friday - Will continue Verapamil 120 mg twice daily for migraine prophylaxis and HTN  Work stress Stressed and depressed with her work situation impacting her personal life at this time.  She has been out of work for the past month due to being in the hospital, going to the urgent care, and not feeling well due to heavy menstruation.  We discussed talking to her job about FMLA paperwork.  She would like a work note today stating that she was out due to medical reasons and is now  able to return to work. - Work note given  Encounter for smoking cessation counseling Using 14 mg nicotine patch 4 out of 7 days of the week.  Requests trying the higher dosage.  Advised her to try to use the patch daily in order to stop smoking on the days that she does not wear the patch.  She understands and agrees.  We discussed that quitting smoking is important for her health, but it is also a difficult habit to break. - Nicotine patch 21 mg    [name], DO [facility]

## 2023-07-09 NOTE — Assessment & Plan Note (Addendum)
She has not been taking her iron supplements because she had occultly affording it over-the-counter.  Also reports that her menstrual cycles have been heavy with clots and occurred twice this month. - CBC recheck - Re-prescribed ferrous sulfate 324 mg daily today and send it to outpatient [place] pharmacy to see if that would be more affordable.

## 2023-07-09 NOTE — Patient Instructions (Addendum)
It was great to see you today! Thank you for choosing [facility] for your primary care. Brittany Graves was seen for hospital follow-up.  Today we addressed: Anemia - checking blood work and will add Provera to help with heavy menstrual cycles. Please take your iron supplementation as well. Hypertension - your blood pressure is high, we recommend you starting Losartan will follow up in a week to check some blood work Regarding work, we recommend asking your employer about FMLA paperwork. Regarding your heavy periods/pap smear - we will set up a follow up appointment to discuss further.  If you haven't already, sign up for My Chart to have easy access to your labs results, and communication with your primary care physician.  We are checking some labs today. If they are abnormal, I will call you. If they are normal, I will send you a MyChart message (if it is active) or a letter in the mail. If you do not hear about your labs in the next 2 weeks, please call the office.  You should return to our clinic Return for BMP check and Pap/STD/Menorrhagia discussion on 07/17/23 on 4:00 PM.  Please arrive 15 minutes before your appointment to ensure smooth check in process.  We appreciate your efforts in making this happen.  Thank you for allowing me to participate in your care, [name], DO 07/09/2023, 2:59 PM PGY-1, [facility]

## 2023-07-09 NOTE — Assessment & Plan Note (Addendum)
Patient reports that her menstrual cycles have been quite heavy and she had 1 at the beginning of this month and restarted her cycle at the end of this month.  We discussed monitoring her cycle with how many pads she goes through and the presence of clots. - Will consider Provera if hemoglobin is low and if her menstrual cycles continue to be heavy - Will discuss contraceptive options versus hysterectomy at her next visit to help improve her menstrual cycles at her next visit

## 2023-07-09 NOTE — Assessment & Plan Note (Signed)
Using 14 mg nicotine patch 4 out of 7 days of the week.  Requests trying the higher dosage.  Advised her to try to use the patch daily in order to stop smoking on the days that she does not wear the patch.  She understands and agrees.  We discussed that quitting smoking is important for her health, but it is also a difficult habit to break. - Nicotine patch 21 mg

## 2023-07-09 NOTE — Assessment & Plan Note (Addendum)
Stressed and depressed with her work situation impacting her personal life at this time.  She has been out of work for the past month due to being in the hospital, going to the urgent care, and not feeling well due to heavy menstruation.  We discussed talking to her job about FMLA paperwork.  She would like a work note today stating that she was out due to medical reasons and is now able to return to work. - Work note given

## 2023-07-10 ENCOUNTER — Other Ambulatory Visit (HOSPITAL_COMMUNITY): Payer: Self-pay

## 2023-07-14 ENCOUNTER — Ambulatory Visit: Payer: Medicaid Other | Admitting: Podiatry

## 2023-07-16 ENCOUNTER — Encounter (HOSPITAL_COMMUNITY): Payer: Self-pay

## 2023-07-16 ENCOUNTER — Ambulatory Visit (HOSPITAL_COMMUNITY)
Admission: RE | Admit: 2023-07-16 | Discharge: 2023-07-16 | Discharge status: Against Medical Advice | Payer: Medicaid Other | Source: Ambulatory Visit | Attending: Family Medicine | Admitting: Family Medicine

## 2023-07-16 ENCOUNTER — Ambulatory Visit (HOSPITAL_COMMUNITY): Payer: Medicaid Other

## 2023-07-16 NOTE — ED Triage Notes (Signed)
Pt reports her right ankle and foot is painful x 2.5 months.   Hurts worse with pressure   Pt reports she hit her right foot on the edge of a wall x 2.5 months ago but never got it checked out since.

## 2023-07-16 NOTE — Progress Notes (Signed)
    SUBJECTIVE:   CHIEF COMPLAINT / HPI: Pap Smear/STD Testing/Contraception/Pelvic US  Patient has not had sexual intercourse in the past couple weeks, last time was prior to her LMP on 7/25 with the same partner for the past two years, however the last couple times she has had intercourse it has been unprotected. She feels like she may have picked up an STD from her partner due to have more discharge than normal and an odor for about a week. She endorses taking more showers during the day to help with the smell.   Wanted to talk about contraception options as well due to having heavy menstrual cycles and clots. She believes her mom started having similar symptoms in her 30s and ended up getting a hysterectomy.   Has been using her nicotine patches daily. She has not been smoking with the patches on. Maybe 1-2 cigarettes a day after taking the patches off prior to shower.   PERTINENT  PMH / PSH: HTN, tobacco use, hidradenitis suppurativa   OBJECTIVE:   BP (!) 148/95   Pulse 95   Ht 5\' 4"  (1.626 m)   Wt 229 lb 3.2 oz (104 kg)   LMP 07/01/2023 (Exact Date)   SpO2 100%   BMI 39.34 kg/m   General: NAD, awake and alert Cardiovascular: RRR. No M/R/G Respiratory: CTAB, normal WOB on RA. No wheezing, crackles, rhonchi, or diminished breath sounds. Abdomen: Soft, non-tender, non-distended. Bowel sounds normoactive Extremities: No BLE edema, no deformities or significant joint findings. Peripheral pulses intact. No varicosities. Skin: Warm and dry. Neuro: A&Ox3. No focal neurological deficits. GU: Normally developed genitalia with no external lesions or eruptions. Vagina had white, foul-smelling discharge and cervix could not be seen due to mass/fibroid causing her pain and pushing cervix posteriorly.   ASSESSMENT/PLAN:   HTN (hypertension) Patient has not picked up her Losartan 50 mg yet due to having issues financially. Her BP remained elevated at this visit and patient was highly  encouraged to acquire and start her medication as soon as possible. Patient has had a tubal ligation with her last pregnancy so there is no concern for pregnancy at this time. - Patient will notify when she picks up/starts her prescription - Will check BMP 2 weeks after patient starts medication  Encounter for screening for cervical cancer Pap smear could not be performed today due to difficulty locating her cervix due to mass/fibroid palpated on bimanual exam. - Referral to GYN and will defer pap smear and IUD vs hysterectomy to them  Menorrhagia Discussed contraception options with the patient. She would like to consider an IUD. However upon performing a bimanual exam and attempting to do a pap smear, there seems to be a mass/fibroid that is pushing her cervix quite posterior. Deferred pap smear today. - Pelvic US ordered to assess for mass/fibroid - Referral to GYN for follow up s/p [place] to consider next steps  Screening for STD (sexually transmitted disease) Wet prep positive for Trichomoniasis and BV. Informed patient that herself and her partner need to be treated for Trichomoniasis. - Flagyl 500 mg BID x 7 days prescribed for patient - Flagyl 500 mg x 4 once prescribed for her partner, paperwork filled out and faxed to pharmacy   [name], DO [facility]

## 2023-07-17 ENCOUNTER — Ambulatory Visit (INDEPENDENT_AMBULATORY_CARE_PROVIDER_SITE_OTHER): Payer: Medicaid Other | Admitting: Family Medicine

## 2023-07-17 ENCOUNTER — Other Ambulatory Visit (HOSPITAL_COMMUNITY): Admission: RE | Admit: 2023-07-17 | Payer: Medicaid Other | Source: Ambulatory Visit | Admitting: Family Medicine

## 2023-07-17 ENCOUNTER — Other Ambulatory Visit (HOSPITAL_COMMUNITY): Payer: Self-pay

## 2023-07-17 VITALS — BP 148/95 | HR 95 | Ht 64.0 in | Wt 229.2 lb

## 2023-07-17 DIAGNOSIS — I10 Essential (primary) hypertension: Secondary | ICD-10-CM | POA: Diagnosis not present

## 2023-07-17 DIAGNOSIS — Z124 Encounter for screening for malignant neoplasm of cervix: Secondary | ICD-10-CM | POA: Diagnosis not present

## 2023-07-17 DIAGNOSIS — Z113 Encounter for screening for infections with a predominantly sexual mode of transmission: Secondary | ICD-10-CM | POA: Insufficient documentation

## 2023-07-17 DIAGNOSIS — N920 Excessive and frequent menstruation with regular cycle: Secondary | ICD-10-CM

## 2023-07-17 LAB — POCT WET PREP (WET MOUNT): Clue Cells Wet Prep Whiff POC: POSITIVE

## 2023-07-17 MED ORDER — METRONIDAZOLE 500 MG PO TABS
500.0000 mg | ORAL_TABLET | Freq: Two times a day (BID) | ORAL | 0 refills | Status: DC
  Filled 2023-07-17: qty 14, 7d supply, fill #0

## 2023-07-17 NOTE — Assessment & Plan Note (Addendum)
Pap smear could not be performed today due to difficulty locating her cervix due to mass/fibroid palpated on bimanual exam. - Referral to GYN and will defer pap smear and IUD vs hysterectomy to them

## 2023-07-17 NOTE — Assessment & Plan Note (Addendum)
Patient has not picked up her Losartan 50 mg yet due to having issues financially. Her BP remained elevated at this visit and patient was highly encouraged to acquire and start her medication as soon as possible. Patient has had a tubal ligation with her last pregnancy so there is no concern for pregnancy at this time. - Patient will notify when she picks up/starts her prescription - Will check BMP 2 weeks after patient starts medication

## 2023-07-17 NOTE — Assessment & Plan Note (Addendum)
Discussed contraception options with the patient. She would like to consider an IUD. However upon performing a bimanual exam and attempting to do a pap smear, there seems to be a mass/fibroid that is pushing her cervix quite posterior. Deferred pap smear today. - Pelvic US ordered to assess for mass/fibroid - Referral to GYN for follow up s/p [place] to consider next steps

## 2023-07-17 NOTE — Patient Instructions (Addendum)
It was great to see you today! Thank you for choosing [facility] for your primary care. Johnae L Glanzer was seen for STD screening, discussing contraception options, and pap smear (which couldn't be performed).  Today we addressed: STD screening - will follow up with results on MyChart (per your preference) Contraception options vs Hysterectomy for menorrhagia can be discussed with GYN after you get your pelvic ultrasound which I ordered today. Your Pelvic US is scheduled for August 13th at [facility] Started treatment of Trichomoniasis and Bacterial Vaginosis with Flagyl 500 mg twice a day every 12 hours x 7 days.  Your partner also needs to be treated for Trichomoniasis. We have sent in the prescription to the [facility] and he needs to take 4 500 mg tablets of Flagyl ONCE.   If you haven't already, sign up for My Chart to have easy access to your labs results, and communication with your primary care physician.  We are checking some labs today. If they are abnormal or normal, I will message you on MyChart, as per your preference or send a letter in the mail. If you do not hear about your labs in the next 2 weeks, please call the office.  Thank you for allowing me to participate in your care, [name], DO 07/17/2023, 4:44 PM PGY-1, [facility]

## 2023-07-17 NOTE — Assessment & Plan Note (Signed)
Wet prep positive for Trichomoniasis and BV. Informed patient that herself and her partner need to be treated for Trichomoniasis. - Flagyl 500 mg BID x 7 days prescribed for patient - Flagyl 500 mg x 4 once prescribed for her partner, paperwork filled out and faxed to pharmacy

## 2023-07-21 ENCOUNTER — Other Ambulatory Visit (HOSPITAL_COMMUNITY): Payer: Self-pay

## 2023-07-21 ENCOUNTER — Ambulatory Visit (HOSPITAL_COMMUNITY)
Admission: RE | Admit: 2023-07-21 | Discharge: 2023-07-21 | Disposition: A | Discharge status: Home / Self Care | Payer: Medicaid Other | Source: Ambulatory Visit | Attending: Family Medicine | Admitting: Family Medicine

## 2023-07-21 ENCOUNTER — Encounter: Payer: Medicaid Other | Admitting: Obstetrics and Gynecology

## 2023-07-21 DIAGNOSIS — N920 Excessive and frequent menstruation with regular cycle: Secondary | ICD-10-CM | POA: Diagnosis present

## 2023-07-23 ENCOUNTER — Ambulatory Visit: Payer: Medicaid Other | Admitting: Podiatry

## 2023-08-03 ENCOUNTER — Ambulatory Visit: Payer: Medicaid Other | Admitting: Podiatry

## 2023-08-10 ENCOUNTER — Encounter (HOSPITAL_COMMUNITY): Payer: Self-pay | Admitting: *Deleted

## 2023-08-10 ENCOUNTER — Other Ambulatory Visit: Payer: Self-pay

## 2023-08-10 ENCOUNTER — Ambulatory Visit (HOSPITAL_COMMUNITY)
Admission: EM | Admit: 2023-08-10 | Discharge: 2023-08-10 | Disposition: A | Discharge status: Home / Self Care | Payer: Medicaid Other | Attending: Family Medicine | Admitting: Family Medicine

## 2023-08-10 DIAGNOSIS — D259 Leiomyoma of uterus, unspecified: Secondary | ICD-10-CM | POA: Diagnosis not present

## 2023-08-10 DIAGNOSIS — K529 Noninfective gastroenteritis and colitis, unspecified: Secondary | ICD-10-CM | POA: Diagnosis not present

## 2023-08-10 DIAGNOSIS — R1030 Lower abdominal pain, unspecified: Secondary | ICD-10-CM

## 2023-08-10 MED ORDER — ONDANSETRON 4 MG PO TBDP: 4.0000 mg | ORAL_TABLET | Freq: Three times a day (TID) | ORAL | 0 refills | Status: DC | PRN

## 2023-08-10 MED ORDER — NAPROXEN 500 MG PO TABS: 500.0000 mg | ORAL_TABLET | Freq: Two times a day (BID) | ORAL | 0 refills | Status: DC | PRN

## 2023-08-10 NOTE — Discharge Instructions (Signed)
Ondansetron dissolved in the mouth every 8 hours as needed for nausea or vomiting. Clear liquids(water, gatorade/pedialyte, ginger ale/sprite, chicken broth/soup) and bland things(crackers/toast, rice, potato, bananas) to eat. Avoid acidic foods like lemon/lime/orange/tomato, and avoid greasy/spicy foods.  Take naproxen 500 mg--1 tablet every 12 hours as needed for pain  As soon as you have collected a stool specimen, please bring it to the clinic.  If you cannot bring it in the next hour from collection time, put it into the refrigerator until you are able to bring it back.  Staff will notify you if anything is positive on that.

## 2023-08-10 NOTE — ED Provider Notes (Signed)
[facility]    CSN: [id number] Arrival date & time: 08/10/23  1607      History   Chief Complaint Chief Complaint  Patient presents with   Abdominal Pain    HPI Brittany Graves is a 37 y.o. female.    Abdominal Pain  Here for lower abdominal pain that began 2 days ago.  She did also have nausea and vomiting at that time, and last emesis was last night.  She has had some diarrhea off and on in the last 3 days.  Her pain is in her suprapubic area.  And sometimes it is radiating into her leg.  She does report that she has a history of fibroids.  On review of her chart, there were about 3 fibroids that were ranging in size from 4-1/2 to 6 cm in diameter.  Of note on August 30 she was eating at her place of work when she saw something in her soup that she thought might be a worm or at least an insect.  She then started having her pain and nausea and diarrhea about 24 hours later  Last menstrual cycle was August 20  Past Medical History:  Diagnosis Date   Hypertension     Patient Active Problem List   Diagnosis Date Noted   Encounter for screening for cervical cancer 07/17/2023   Screening for STD (sexually transmitted disease) 07/17/2023   Menorrhagia 07/09/2023   Work stress 07/09/2023   Iron deficiency anemia 06/19/2023   Encounter for smoking cessation counseling 06/19/2023   HTN (hypertension) 06/18/2023   Headache 06/18/2023   Stroke (HCC) 06/18/2023   Does not have primary care provider 06/18/2023   Weakness 06/18/2023    Past Surgical History:  Procedure Laterality Date   TUBAL LIGATION      OB History   No obstetric history on file.      Home Medications    Prior to Admission medications   Medication Sig Start Date End Date Taking? Authorizing Provider  ferrous sulfate 324 MG TBEC Take 1 tablet (324 mg total) by mouth daily. 07/09/23  Yes [name], DO  losartan (COZAAR) 50 MG tablet Take 1 tablet (50 mg total) by mouth at bedtime.  07/09/23  Yes [name], DO  naproxen (NAPROSYN) 500 MG tablet Take 1 tablet (500 mg total) by mouth 2 (two) times daily as needed (pain). 08/10/23  Yes Zenia Resides, MD  ondansetron (ZOFRAN-ODT) 4 MG disintegrating tablet Take 1 tablet (4 mg total) by mouth every 8 (eight) hours as needed for nausea or vomiting. 08/10/23  Yes Cornella Emmer, Janace Aris, MD  nicotine (NICODERM CQ) 21 mg/24hr patch Place 1 patch (21 mg total) onto the skin daily. 07/09/23   [name], DO  polyethylene glycol powder (GLYCOLAX/MIRALAX) 17 GM/SCOOP powder Take 17 g by mouth daily. 06/19/23   Glendale Chard, DO  potassium chloride (KLOR-CON) 10 MEQ tablet Take 2 tablets (20 mEq total) by mouth daily. Patient taking differently: Take 10 mEq by mouth daily. 03/03/23   Prosperi, Christian H, PA-C  senna-docusate (SENOKOT-S) 8.6-50 MG tablet Take 1 tablet by mouth daily. 06/19/23   Glendale Chard, DO  verapamil (CALAN-SR) 120 MG CR tablet Take 1 tablet (120 mg total) by mouth 2 (two) times daily. 06/19/23 07/19/23  Glendale Chard, DO    Family History Family History  Problem Relation Age of Onset   Hypertension Mother    Diabetes Mother    Heart disease Mother    Thyroid disease Mother  Hypertension Father    Diabetes Father    Hypertension Sister    Thyroid disease Sister     Social History Social History   Tobacco Use   Smoking status: Some Days   Smokeless tobacco: Never  Substance Use Topics   Alcohol use: No     Allergies   Patient has no known allergies.   Review of Systems Review of Systems  Gastrointestinal:  Positive for abdominal pain.     Physical Exam Triage Vital Signs ED Triage Vitals  Encounter Vitals Group     BP 08/10/23 1649 (!) 131/90     Systolic BP Percentile --      Diastolic BP Percentile --      Pulse Rate 08/10/23 1649 92     Resp 08/10/23 1649 18     Temp 08/10/23 1649 98 F (36.7 C)     Temp src --      SpO2 08/10/23 1649 97 %     Weight --      Height --       Head Circumference --      Peak Flow --      Pain Score 08/10/23 1645 9     Pain Loc --      Pain Education --      Exclude from Growth Chart --    No data found.  Updated Vital Signs BP (!) 131/90   Pulse 92   Temp 98 F (36.7 C)   Resp 18   LMP 07/28/2023   SpO2 97%   Visual Acuity Right Eye Distance:   Left Eye Distance:   Bilateral Distance:    Right Eye Near:   Left Eye Near:    Bilateral Near:     Physical Exam Vitals reviewed.  Constitutional:      General: She is not in acute distress.    Appearance: She is not ill-appearing, toxic-appearing or diaphoretic.  HENT:     Mouth/Throat:     Mouth: Mucous membranes are moist.  Eyes:     Extraocular Movements: Extraocular movements intact.     Conjunctiva/sclera: Conjunctivae normal.     Pupils: Pupils are equal, round, and reactive to light.  Cardiovascular:     Rate and Rhythm: Normal rate and regular rhythm.     Heart sounds: No murmur heard. Pulmonary:     Effort: Pulmonary effort is normal.     Breath sounds: Normal breath sounds.  Musculoskeletal:     Cervical back: Neck supple.  Lymphadenopathy:     Cervical: No cervical adenopathy.  Skin:    Coloration: Skin is not jaundiced or pale.  Neurological:     General: No focal deficit present.     Mental Status: She is alert and oriented to person, place, and time.  Psychiatric:        Behavior: Behavior normal.      UC Treatments / Results  Labs (all labs ordered are listed, but only abnormal results are displayed) Labs Reviewed - No data to display  EKG   Radiology No results found.  Procedures Procedures (including critical care time)  Medications Ordered in UC Medications - No data to display  Initial Impression / Assessment and Plan / UC Course  I have reviewed the triage vital signs and the nursing notes.  Pertinent labs & imaging results that were available during my care of the patient were reviewed by me and considered in my  medical decision making (see chart for details).  Stool specimen is ordered for O&P examination.  She will collect that and return it to [place].  Zofran is sent in for the nausea.  Naproxen is sent in for the pain that could actually be due to her fibroids.    Final Clinical Impressions(s) / UC Diagnoses   Final diagnoses:  Lower abdominal pain  Gastroenteritis  Uterine leiomyoma, unspecified location     Discharge Instructions      Ondansetron dissolved in the mouth every 8 hours as needed for nausea or vomiting. Clear liquids(water, gatorade/pedialyte, ginger ale/sprite, chicken broth/soup) and bland things(crackers/toast, rice, potato, bananas) to eat. Avoid acidic foods like lemon/lime/orange/tomato, and avoid greasy/spicy foods.  Take naproxen 500 mg--1 tablet every 12 hours as needed for pain  As soon as you have collected a stool specimen, please bring it to the clinic.  If you cannot bring it in the next hour from collection time, put it into the refrigerator until you are able to bring it back.  Staff will notify you if anything is positive on that.    ED Prescriptions     Medication Sig Dispense Auth. Provider   ondansetron (ZOFRAN-ODT) 4 MG disintegrating tablet Take 1 tablet (4 mg total) by mouth every 8 (eight) hours as needed for nausea or vomiting. 10 tablet Zenia Resides, MD   naproxen (NAPROSYN) 500 MG tablet Take 1 tablet (500 mg total) by mouth 2 (two) times daily as needed (pain). 30 tablet Sorren Vallier, Janace Aris, MD      PDMP not reviewed this encounter.   Zenia Resides, MD 08/10/23 [phone]

## 2023-08-10 NOTE — ED Triage Notes (Signed)
Pt reports she was eating soup at work and saw a worm in her soup and has ABD pain. Pt also reports a recent DX of Fibroids and that may be the cause of ABD pain. Pt reports ABD pain goes down her Rt leg.

## 2023-08-11 ENCOUNTER — Ambulatory Visit (HOSPITAL_COMMUNITY): Payer: Medicaid Other

## 2023-08-12 ENCOUNTER — Ambulatory Visit (INDEPENDENT_AMBULATORY_CARE_PROVIDER_SITE_OTHER): Payer: Medicaid Other | Admitting: Obstetrics and Gynecology

## 2023-08-12 ENCOUNTER — Other Ambulatory Visit: Payer: Self-pay

## 2023-08-12 ENCOUNTER — Other Ambulatory Visit (HOSPITAL_COMMUNITY)
Admission: RE | Admit: 2023-08-12 | Discharge: 2023-08-12 | Disposition: A | Discharge status: Home / Self Care | Payer: Medicaid Other | Source: Ambulatory Visit | Attending: Obstetrics and Gynecology | Admitting: Obstetrics and Gynecology

## 2023-08-12 ENCOUNTER — Encounter: Payer: Self-pay | Admitting: Obstetrics and Gynecology

## 2023-08-12 VITALS — BP 123/89 | HR 98 | Ht 65.0 in | Wt 227.4 lb

## 2023-08-12 DIAGNOSIS — Z3042 Encounter for surveillance of injectable contraceptive: Secondary | ICD-10-CM

## 2023-08-12 DIAGNOSIS — D500 Iron deficiency anemia secondary to blood loss (chronic): Secondary | ICD-10-CM

## 2023-08-12 DIAGNOSIS — N920 Excessive and frequent menstruation with regular cycle: Secondary | ICD-10-CM | POA: Diagnosis not present

## 2023-08-12 DIAGNOSIS — Z8619 Personal history of other infectious and parasitic diseases: Secondary | ICD-10-CM | POA: Diagnosis not present

## 2023-08-12 DIAGNOSIS — Z124 Encounter for screening for malignant neoplasm of cervix: Secondary | ICD-10-CM | POA: Diagnosis not present

## 2023-08-12 DIAGNOSIS — D251 Intramural leiomyoma of uterus: Secondary | ICD-10-CM

## 2023-08-12 DIAGNOSIS — N946 Dysmenorrhea, unspecified: Secondary | ICD-10-CM

## 2023-08-12 DIAGNOSIS — I10 Essential (primary) hypertension: Secondary | ICD-10-CM

## 2023-08-12 DIAGNOSIS — I639 Cerebral infarction, unspecified: Secondary | ICD-10-CM

## 2023-08-12 DIAGNOSIS — Z6838 Body mass index (BMI) 38.0-38.9, adult: Secondary | ICD-10-CM

## 2023-08-12 MED ORDER — MEDROXYPROGESTERONE ACETATE 150 MG/ML IM SUSY
150.0000 mg | PREFILLED_SYRINGE | Freq: Once | INTRAMUSCULAR | Status: AC
Start: 2023-08-12 — End: 2023-08-12
  Administered 2023-08-12: 150 mg via INTRAMUSCULAR

## 2023-08-12 NOTE — Progress Notes (Unsigned)
Obstetrics and Gynecology New Patient Evaluation  Appointment Date: 08/12/2023  OBGYN Clinic: [facility]  Primary Care Provider: [name]  Referring Provider: [name], DO  Chief Complaint: establish care. Heavy and painful periods, fibroids  History of Present Illness: Brittany Graves is a 37 y.o. African-American [phone] (Patient's last menstrual period was 07/28/2023 (approximate).), seen for the above chief complaint. Her past medical history is significant for July 2024 stroke (?TIA), HTN, tobacco abuse, fibroids, anemia, h/o BTL, BMI 38, h/o trich  Patient with worsened periods with heavier periods over the past two particularly since late 2023 and periods now can be up to 10 days. She has never tried anything for periods except for OTCs and no hormonal birth control.   Patient had recent ?TIA and is now followed by a PCP and she was referred to GYN for further management.   Review of Systems: Pertinent items noted in HPI and remainder of comprehensive ROS otherwise negative.   Patient Active Problem List   Diagnosis Date Noted   Intramural leiomyoma of uterus 08/13/2023   Dysmenorrhea 08/12/2023   Menorrhagia 07/09/2023   Work stress 07/09/2023   Iron deficiency anemia 06/19/2023   Encounter for smoking cessation counseling 06/19/2023   HTN (hypertension) 06/18/2023   Headache 06/18/2023   Stroke (HCC) 06/18/2023   Weakness 06/18/2023    Past Medical History:  Past Medical History:  Diagnosis Date   Hypertension     Past Surgical History:  Past Surgical History:  Procedure Laterality Date   TUBAL LIGATION      Past Obstetrical History:  OB History  Gravida Para Term Preterm AB Living  3 3 3     3   SAB IAB Ectopic Multiple Live Births          3    # Outcome Date GA Lbr Len/2nd Weight Sex Type Anes PTL Lv  3 Term 11/24/09    M Vag-Spont   LIV  2 Term 01/22/09    M Vag-Spont   LIV  1 Term 2004    M  Vag-Spont   LIV    Past Gynecological History: As per HPI. Periods: heavy and painful, can be up to 10 days, qmonth, regular, no intermenstrual bleeding  Social History:  Social History   Socioeconomic History   Marital status: Single    Spouse name: Not on file   Number of children: Not on file   Years of education: Not on file   Highest education level: Not on file  Occupational History   Not on file  Tobacco Use   Smoking status: Some Days   Smokeless tobacco: Never  Vaping Use   Vaping status: Never Used  Substance and Sexual Activity   Alcohol use: Yes    Comment: socially   Drug use: Not Currently   Sexual activity: Yes    Birth control/protection: Surgical, Condom    Comment: BTL in 2011  Other Topics Concern   Not on file  Social History Narrative   Not on file   Social Determinants of Health   Financial Resource Strain: Not on file  Food Insecurity: Food Insecurity Present (08/12/2023)   Hunger Vital Sign    Worried About Running Out of Food in the Last Year: Often true    Ran Out of Food in the Last Year: Sometimes true  Transportation Needs: No Transportation Needs (08/12/2023)   PRAPARE - Administrator, Civil Service (Medical): No  Lack of Transportation (Non-Medical): No  Physical Activity: Not on file  Stress: Not on file  Social Connections: Not on file  Intimate Partner Violence: Not At Risk (06/18/2023)   Humiliation, Afraid, Rape, and Kick questionnaire    Fear of Current or Ex-Partner: No    Emotionally Abused: No    Physically Abused: No    Sexually Abused: No    Family History:  Family History  Problem Relation Age of Onset   Hypertension Mother    Diabetes Mother    Heart disease Mother    Thyroid disease Mother    Hypertension Father    Diabetes Father    Hypertension Sister    Thyroid disease Sister     Medications We administered medroxyPROGESTERone Acetate. Current Outpatient Medications  Medication Sig  Dispense Refill   ferrous sulfate 324 MG TBEC Take 1 tablet (324 mg total) by mouth daily. 90 tablet 3   losartan (COZAAR) 50 MG tablet Take 1 tablet (50 mg total) by mouth at bedtime. 90 tablet 3   nicotine (NICODERM CQ) 21 mg/24hr patch Place 1 patch (21 mg total) onto the skin daily. 28 patch 0   polyethylene glycol powder (GLYCOLAX/MIRALAX) 17 GM/SCOOP powder Take 17 g by mouth daily. 500 g 0   verapamil (CALAN-SR) 120 MG CR tablet Take 1 tablet (120 mg total) by mouth 2 (two) times daily. 60 tablet 0   naproxen (NAPROSYN) 500 MG tablet Take 1 tablet (500 mg total) by mouth 2 (two) times daily as needed (pain). (Patient not taking: Reported on 08/12/2023) 30 tablet 0   ondansetron (ZOFRAN-ODT) 4 MG disintegrating tablet Take 1 tablet (4 mg total) by mouth every 8 (eight) hours as needed for nausea or vomiting. (Patient not taking: Reported on 08/12/2023) 10 tablet 0   potassium chloride (KLOR-CON) 10 MEQ tablet Take 2 tablets (20 mEq total) by mouth daily. (Patient not taking: Reported on 08/12/2023) 60 tablet 0   senna-docusate (SENOKOT-S) 8.6-50 MG tablet Take 1 tablet by mouth daily. (Patient not taking: Reported on 08/12/2023) 90 tablet 0   No current facility-administered medications for this visit.    Allergies Patient has no known allergies.   Physical Exam:  BP 123/89   Pulse 98   Ht 5\' 5"  (1.651 m)   Wt 227 lb 6.4 oz (103.1 kg)   LMP 07/28/2023 (Approximate) Comment: lasts 4-10 days  BMI 37.84 kg/m  Body mass index is 37.84 kg/m. General appearance: Well nourished, well developed female in no acute distress.  Neck:  Supple, normal appearance, and no thyromegaly  Cardiovascular: normal s1 and s2.  No murmurs, rubs or gallops. Respiratory:  Clear to auscultation bilateral. Normal respiratory effort Abdomen: obese, soft, nttp, nd Neuro/Psych:  Normal mood and affect.  Skin:  Warm and dry.  Lymphatic:  No inguinal lymphadenopathy.   Cervical exam performed in the presence of a  chaperone Pelvic exam: is limited by body habitus EGBUS: within normal limits Vagina: within normal limits and with no blood or discharge in the vault Cervix: normal appearing cervix without tenderness, discharge or lesions.  Uterus:  unable to definitively palpate, no obvious masses Adnexa:  normal adnexa and no mass, fullness, tenderness Rectovaginal: deferred  Laboratory: 8/9 +trich and BV,     Latest Ref Rng & Units 08/12/2023    5:09 PM 07/09/2023    3:14 PM 06/19/2023    6:35 AM  CBC  WBC 3.4 - 10.8 x10E3/uL 6.6  6.0  6.7   Hemoglobin 11.1 -  15.9 g/dL 9.4  9.6  8.5   Hematocrit 34.0 - 46.6 % 32.4  33.2  29.2   Platelets 150 - 450 x10E3/uL 308  344  335    TSH, A1c, hiv, coags wnl July 2024  Radiology:  Narrative & Impression  CLINICAL DATA:  menorrhagia and mass/fibroid felt during bimanual exam   EXAM: ULTRASOUND OF PELVIS   TECHNIQUE: Transabdominal and transvaginalultrasound examination of the pelvis was performed including evaluation of the uterus, ovaries, adnexal regions, and pelvic cul-de-sac.   COMPARISON:  None Available.   FINDINGS: Uterusanteverted 13 x 11 x 10 cm. The endometrium 0.6 cm. The uterine cavity appears empty.   The following uterine fibroids were identified.   1. Fundal subserosal, 7.3 x 6.4 x 6.1 cm. 2. Anterior left subserosal, 6.8 x 4.7 x 4.5 cm. 3. Left intramural, 5.1 x 4.0 x 3.9 cm.   Right ovary   Unremarkable, 3.5 x 2.1 x 1.8 cm.   Left ovary   Complex cyst consistent with a hemorrhagic follicle measuring 3.2 cm. Left ovary measurements are 4.7 x 4.1 x 7.5 cm.   Images of the adnexae demonstrated no additional masses or fluid collections   IMPRESSION: 1. Uterine fibroids, described above. 2. Complex cyst left ovary. This can be followed up in 6-12 months with repeat sonography to ensure resolution.     Electronically Signed   By: Layla Maw M.D.   On: 07/26/2023 18:34    Assessment: patient stable  Plan:   1. Encounter for screening for cervical cancer - Cytology - PAP( [place])  2. Menorrhagia with regular cycle U/s images reviewed and it appears she has to large IM fibroids that are coming off both sides of the fundus making her uterus heart shaped; endometrial canal may not be suitable for an IUD and if considered, recommend u/s guidance with placement. I do not recommend estrogen options or Lysteda given her recent ?stroke history/?TIA and risk with progestins d/w her and she is okay with a trial. She would like to do depo provera given she's not sure she can do cyclic progestin; depo provera is usmec category III and since she has ?stroke history and maybe it was more of complex migraine, this is a reasonable choice to consider for her. First shot given today. Ultimately, she is leaning towards hysterectomy, but I told her that she is not medically ready, given her recent history. Will repeat anemia profile and if still anemic refer to Heme for follow up and consideration for IV iron I will also refer to Neuro for further outpatient follow up; per d/c summary, aspirin considered but not started due to anemia and pt states she is not currently on an aspirin.  - medroxyPROGESTERone Acetate SUSY 150 mg  3. Dysmenorrhea  4. History of trichomoniasis Test of cure today  5. Iron deficiency anemia due to chronic blood loss - Ambulatory referral to Hematology / Oncology - Anemia Profile B  6. Intramural leiomyoma of uterus  7. BMI 38.0-38.9,adult  8. Cerebrovascular accident (CVA), unspecified mechanism (HCC)  9. Hypertension, unspecified type  Orders Placed This Encounter  Procedures   Anemia Profile B   Ambulatory referral to Hematology / Oncology   Ambulatory referral to Neurology    Return in about 2 months (around 10/12/2023) for in person, with dr Vergie Living.  No future appointments.  Cornelia Copa MD Attending [facility])

## 2023-08-13 DIAGNOSIS — D251 Intramural leiomyoma of uterus: Secondary | ICD-10-CM | POA: Insufficient documentation

## 2023-08-13 LAB — ANEMIA PROFILE B
Basophils Absolute: 0.1 10*3/uL (ref 0.0–0.2)
Basos: 1 %
EOS (ABSOLUTE): 0.2 10*3/uL (ref 0.0–0.4)
Eos: 3 %
Ferritin: 4 ng/mL — ABNORMAL LOW (ref 15–150)
Folate: 7.6 ng/mL (ref >3.0)
Hematocrit: 32.4 % — ABNORMAL LOW (ref 34.0–46.6)
Hemoglobin: 9.4 g/dL — ABNORMAL LOW (ref 11.1–15.9)
Immature Grans (Abs): 0 10*3/uL (ref 0.0–0.1)
Immature Granulocytes: 0 %
Iron Saturation: 4 % — CL (ref 15–55)
Iron: 18 ug/dL — ABNORMAL LOW (ref 27–159)
Lymphocytes Absolute: 2.5 10*3/uL (ref 0.7–3.1)
Lymphs: 38 %
MCH: 21.5 pg — ABNORMAL LOW (ref 26.6–33.0)
MCHC: 29 g/dL — ABNORMAL LOW (ref 31.5–35.7)
MCV: 74 fL — ABNORMAL LOW (ref 79–97)
Monocytes Absolute: 0.4 10*3/uL (ref 0.1–0.9)
Monocytes: 6 %
Neutrophils Absolute: 3.5 10*3/uL (ref 1.4–7.0)
Neutrophils: 52 %
Platelets: 308 10*3/uL (ref 150–450)
RBC: 4.37 x10E6/uL (ref 3.77–5.28)
RDW: 21.5 % — ABNORMAL HIGH (ref 11.7–15.4)
Retic Ct Pct: 1.4 % (ref 0.6–2.6)
Total Iron Binding Capacity: 430 ug/dL (ref 250–450)
UIBC: 412 ug/dL (ref 131–425)
Vitamin B-12: 501 pg/mL (ref 232–1245)
WBC: 6.6 10*3/uL (ref 3.4–10.8)

## 2023-08-14 LAB — CYTOLOGY - PAP
Chlamydia: NEGATIVE
Comment: NEGATIVE
Comment: NEGATIVE
Comment: NEGATIVE
Comment: NEGATIVE
Comment: NEGATIVE
Comment: NORMAL
Diagnosis: UNDETERMINED — AB
HPV 16: NEGATIVE
HPV 18 / 45: NEGATIVE
High risk HPV: POSITIVE — AB
Neisseria Gonorrhea: NEGATIVE
Trichomonas: NEGATIVE

## 2023-08-18 ENCOUNTER — Ambulatory Visit: Payer: Medicaid Other | Admitting: Neurology

## 2023-08-18 ENCOUNTER — Encounter: Payer: Self-pay | Admitting: Neurology

## 2023-08-18 ENCOUNTER — Telehealth: Payer: Self-pay

## 2023-08-18 ENCOUNTER — Other Ambulatory Visit (HOSPITAL_COMMUNITY): Payer: Self-pay

## 2023-08-18 VITALS — BP 148/101 | HR 92 | Ht 65.0 in | Wt 226.0 lb

## 2023-08-18 DIAGNOSIS — D508 Other iron deficiency anemias: Secondary | ICD-10-CM

## 2023-08-18 DIAGNOSIS — G43709 Chronic migraine without aura, not intractable, without status migrainosus: Secondary | ICD-10-CM | POA: Insufficient documentation

## 2023-08-18 MED ORDER — RIZATRIPTAN BENZOATE 5 MG PO TBDP
5.0000 mg | ORAL_TABLET | ORAL | 6 refills | Status: DC | PRN
  Filled 2023-08-18: qty 10, 30d supply, fill #0

## 2023-08-18 NOTE — Telephone Encounter (Signed)
Patient calls nurse line requesting results from Dr. Vergie Living office.   She reports she has not heard from anyone "over there."   I advised to give his office a call for results.

## 2023-08-18 NOTE — Progress Notes (Signed)
Chief Complaint  Patient presents with   New Patient (Initial Visit)    Emg rm 3,  internal referral for possibleTIA: went to hospital w/stroke like symptoms in July. Pt stated now she feels pretty good just gets some serious headaches. Pt stated 2 days ago she had abdominal pain and body soreness. Pt stated right feels weaker and pinched nerve feeling and right leg has pain and tries to give out upon walking       ASSESSMENT AND PLAN  Brittany Graves is a 37 y.o. female   Chronic migraine  Maxalt as needed Iron deficiency anemia  Ferritin 1, complains severe fatigue, lack of stamina, will refer to hematologist for treatment to   DIAGNOSTIC DATA (LABS, IMAGING, TESTING) - I reviewed patient records, labs, notes, testing and imaging myself where available.   MEDICAL HISTORY:  Brittany Graves is a 37 year old female, seen in request by her primary care doctor [name], for evaluation of frequent headaches, initial evaluation was on August 18, 2023  I reviewed and summarized the referring note.PMHx: HTN Iron Deficiency anemia   She began to have frequent headaches since 2023, severe episode 2-3 times each months, lateralized behind eyes pounding headache with light noise sensitivity, nauseous, constant around her menstruation,  MRI of the brain June 18, 2023 showed no acute intracranial abnormality,  air-fluid level at the right maxillary sinus  Lab in Sept 2024: Hg 9.6, MCH 20.6, normal TSH, A1C 5.5, LDL 93,  Iron 11, ferritin 1, negative HIV,   She complains of feeling fatigue, lack of stamina, take frequent sick leave, heavy menstruation, hope to get quick treatment for her iron deficiency anemia,  PHYSICAL EXAM:   Vitals:   08/18/23 1250 08/18/23 1257  BP: (!) 157/100 (!) 148/101  Pulse: 92   Weight: 226 lb (102.5 kg)   Height: 5\' 5"  (1.651 m)    Not recorded     Body mass index is 37.61 kg/m.  PHYSICAL EXAMNIATION:  Gen: NAD, conversant, well  nourised, well groomed                     Cardiovascular: Regular rate rhythm, no peripheral edema, warm, nontender. Eyes: Conjunctivae clear without exudates or hemorrhage Neck: Supple, no carotid bruits. Pulmonary: Clear to auscultation bilaterally   NEUROLOGICAL EXAM:  MENTAL STATUS: Speech/cognition: Tired Looking young female, awake, alert, oriented to history taking and casual conversation CRANIAL NERVES: CN II: Visual fields are full to confrontation. Pupils are round equal and briskly reactive to light. CN III, IV, VI: extraocular movement are normal. No ptosis. CN V: Facial sensation is intact to light touch CN VII: Face is symmetric with normal eye closure  CN VIII: Hearing is normal to causal conversation. CN IX, X: Phonation is normal. CN XI: Head turning and shoulder shrug are intact  MOTOR: There is no pronator drift of out-stretched arms. Muscle bulk and tone are normal. Muscle strength is normal.  REFLEXES: Reflexes are 2+ and symmetric at the biceps, triceps, knees, and ankles. Plantar responses are flexor.  SENSORY: Intact to light touch, pinprick and vibratory sensation are intact in fingers and toes.  COORDINATION: There is no trunk or limb dysmetria noted.  GAIT/STANCE: Posture is normal. Gait is steady with normal steps, base, arm swing, and turning. Heel and toe walking are normal. Tandem gait is normal.  Romberg is absent.  REVIEW OF SYSTEMS:  Full 14 system review of systems performed and notable only for as above All  other review of systems were negative.   ALLERGIES: No Known Allergies  HOME MEDICATIONS: Current Outpatient Medications  Medication Sig Dispense Refill   ferrous sulfate 324 MG TBEC Take 1 tablet (324 mg total) by mouth daily. 90 tablet 3   losartan (COZAAR) 50 MG tablet Take 1 tablet (50 mg total) by mouth at bedtime. 90 tablet 3   naproxen (NAPROSYN) 500 MG tablet Take 1 tablet (500 mg total) by mouth 2 (two) times daily as  needed (pain). 30 tablet 0   nicotine (NICODERM CQ) 21 mg/24hr patch Place 1 patch (21 mg total) onto the skin daily. 28 patch 0   ondansetron (ZOFRAN-ODT) 4 MG disintegrating tablet Take 1 tablet (4 mg total) by mouth every 8 (eight) hours as needed for nausea or vomiting. 10 tablet 0   polyethylene glycol powder (GLYCOLAX/MIRALAX) 17 GM/SCOOP powder Take 17 g by mouth daily. 500 g 0   potassium chloride (KLOR-CON) 10 MEQ tablet Take 2 tablets (20 mEq total) by mouth daily. 60 tablet 0   senna-docusate (SENOKOT-S) 8.6-50 MG tablet Take 1 tablet by mouth daily. 90 tablet 0   verapamil (CALAN-SR) 120 MG CR tablet Take 1 tablet (120 mg total) by mouth 2 (two) times daily. 60 tablet 0   No current facility-administered medications for this visit.    PAST MEDICAL HISTORY: Past Medical History:  Diagnosis Date   Hypertension     PAST SURGICAL HISTORY: Past Surgical History:  Procedure Laterality Date   TUBAL LIGATION      FAMILY HISTORY: Family History  Problem Relation Age of Onset   Hypertension Mother    Diabetes Mother    Heart disease Mother    Thyroid disease Mother    Hypertension Father    Diabetes Father    Hypertension Sister    Thyroid disease Sister     SOCIAL HISTORY: Social History   Socioeconomic History   Marital status: Single    Spouse name: Not on file   Number of children: Not on file   Years of education: Not on file   Highest education level: Not on file  Occupational History   Not on file  Tobacco Use   Smoking status: Some Days   Smokeless tobacco: Never  Vaping Use   Vaping status: Never Used  Substance and Sexual Activity   Alcohol use: Yes    Comment: socially   Drug use: Not Currently   Sexual activity: Yes    Birth control/protection: Surgical, Condom    Comment: BTL in 2011  Other Topics Concern   Not on file  Social History Narrative   Not on file   Social Determinants of Health   Financial Resource Strain: Not on file  Food  Insecurity: Food Insecurity Present (08/12/2023)   Hunger Vital Sign    Worried About Running Out of Food in the Last Year: Often true    Ran Out of Food in the Last Year: Sometimes true  Transportation Needs: No Transportation Needs (08/12/2023)   PRAPARE - Administrator, Civil Service (Medical): No    Lack of Transportation (Non-Medical): No  Physical Activity: Not on file  Stress: Not on file  Social Connections: Not on file  Intimate Partner Violence: Not At Risk (06/18/2023)   Humiliation, Afraid, Rape, and Kick questionnaire    Fear of Current or Ex-Partner: No    Emotionally Abused: No    Physically Abused: No    Sexually Abused: No  Levert Feinstein, M.D. Ph.D.  [facility] [address] [place], [place] [address] Ph: [phone] Fax: [phone]  CC:  Moody Bing, MD [address] [place],  [place] [address]  [name], DO

## 2023-08-19 ENCOUNTER — Other Ambulatory Visit: Payer: Self-pay | Admitting: Student

## 2023-08-20 ENCOUNTER — Encounter: Payer: Self-pay | Admitting: Obstetrics and Gynecology

## 2023-08-20 DIAGNOSIS — R87610 Atypical squamous cells of undetermined significance on cytologic smear of cervix (ASC-US): Secondary | ICD-10-CM | POA: Insufficient documentation

## 2023-08-27 ENCOUNTER — Telehealth: Payer: Self-pay | Admitting: Medical Oncology

## 2023-08-27 NOTE — Telephone Encounter (Signed)
LVM to keep her appt 10/02 with Dr, Arbutus Ped. It was not cancelled that I am aware of.

## 2023-08-28 ENCOUNTER — Other Ambulatory Visit (HOSPITAL_COMMUNITY): Payer: Self-pay

## 2023-09-03 ENCOUNTER — Ambulatory Visit (HOSPITAL_COMMUNITY): Payer: Medicaid Other

## 2023-09-04 ENCOUNTER — Encounter (HOSPITAL_COMMUNITY): Payer: Self-pay

## 2023-09-04 ENCOUNTER — Ambulatory Visit (HOSPITAL_COMMUNITY)
Admission: RE | Admit: 2023-09-04 | Discharge: 2023-09-04 | Disposition: A | Discharge status: Home / Self Care | Payer: Medicaid Other | Source: Ambulatory Visit | Attending: Internal Medicine

## 2023-09-04 ENCOUNTER — Ambulatory Visit (HOSPITAL_COMMUNITY): Payer: Medicaid Other

## 2023-09-04 ENCOUNTER — Other Ambulatory Visit (HOSPITAL_COMMUNITY): Payer: Self-pay

## 2023-09-04 VITALS — BP 140/91 | HR 100 | Temp 98.9°F | Resp 17 | Ht 66.0 in | Wt 230.0 lb

## 2023-09-04 DIAGNOSIS — L0201 Cutaneous abscess of face: Secondary | ICD-10-CM

## 2023-09-04 MED ORDER — AMOXICILLIN-POT CLAVULANATE 875-125 MG PO TABS
1.0000 | ORAL_TABLET | Freq: Two times a day (BID) | ORAL | 0 refills | Status: DC
  Filled 2023-09-04: qty 14, 7d supply, fill #0

## 2023-09-04 MED ORDER — IBUPROFEN 800 MG PO TABS
800.0000 mg | ORAL_TABLET | Freq: Three times a day (TID) | ORAL | 0 refills | Status: DC
  Filled 2023-09-04: qty 21, 7d supply, fill #0

## 2023-09-04 NOTE — Discharge Instructions (Signed)
Your dental pain is likely due to dental infection. Take  antibiotic as prescribed for the next 7 days to treat your dental infection. Continue use of ibuprofen as needed with food for dental inflammation and pain.   You may also use tylenol as needed for pain. Perform salt water gargles every 3-4 hours.  Schedule an appointment with one of the dentists on the list provided to urgent care today.  If you develop any new or worsening symptoms or if your symptoms do not start to improve, pleases return here or follow-up with your primary care provider. If your symptoms are severe, please go to the emergency room.

## 2023-09-04 NOTE — ED Provider Notes (Signed)
[facility]    CSN: [id number] Arrival date & time: 09/04/23  1601      History   Chief Complaint Chief Complaint  Patient presents with   Abscess    Entered by patient    HPI Brittany Graves is a 37 y.o. female.   Patient presents to urgent care for evaluation of possible abscess to the left maxillofacial region that started approximately 2 days ago.  She has noticed worsening swelling and pain to the left maxillary region over the last 2 days with associated warmth and tenderness.  Denies dental pain, recent trauma/injury to the face, fever/chills, rash, difficulty swallowing, throat closure sensation, dizziness, ear pain, and headache.  Reports she does not have a dentist and does not have routine cleanings.  She is a smoker (cigarettes) denies other drug use.  Last menstrual cycle was July 28, 2023 (no chance of pregnancy-status post tubal ligation).  No recent antibiotic/steroid use.  Denies history of immunosuppression.  She has not attempted use of any over-the-counter medications PTA for symptomatic relief.   Abscess   Past Medical History:  Diagnosis Date   Hypertension     Patient Active Problem List   Diagnosis Date Noted   ASCUS with positive high risk HPV cervical 08/20/2023   Chronic migraine w/o aura w/o status migrainosus, not intractable 08/18/2023   Intramural leiomyoma of uterus 08/13/2023   Dysmenorrhea 08/12/2023   Menorrhagia 07/09/2023   Work stress 07/09/2023   Iron deficiency anemia 06/19/2023   Encounter for smoking cessation counseling 06/19/2023   HTN (hypertension) 06/18/2023   Headache 06/18/2023   Stroke (HCC) 06/18/2023   Weakness 06/18/2023    Past Surgical History:  Procedure Laterality Date   TUBAL LIGATION      OB History     Gravida  3   Para  3   Term  3   Preterm      AB      Living  3      SAB      IAB      Ectopic      Multiple      Live Births  3            Home Medications     Prior to Admission medications   Medication Sig Start Date End Date Taking? Authorizing Provider  amoxicillin-clavulanate (AUGMENTIN) 875-125 MG tablet Take 1 tablet by mouth every 12 (twelve) hours. 09/04/23  Yes Carlisle Beers, FNP  ferrous sulfate 324 MG TBEC Take 1 tablet (324 mg total) by mouth daily. 07/09/23  Yes [name], DO  ibuprofen (ADVIL) 800 MG tablet Take 1 tablet (800 mg total) by mouth 3 (three) times daily. 09/04/23  Yes Carlisle Beers, FNP  losartan (COZAAR) 50 MG tablet Take 1 tablet (50 mg total) by mouth at bedtime. 07/09/23  Yes [name], DO  naproxen (NAPROSYN) 500 MG tablet Take 1 tablet (500 mg total) by mouth 2 (two) times daily as needed (pain). 08/10/23  Yes Banister, Janace Aris, MD  nicotine (NICODERM CQ) 21 mg/24hr patch Place 1 patch (21 mg total) onto the skin daily. 07/09/23  Yes Gomes, Adriana, DO  ondansetron (ZOFRAN-ODT) 4 MG disintegrating tablet Take 1 tablet (4 mg total) by mouth every 8 (eight) hours as needed for nausea or vomiting. 08/10/23  Yes Zenia Resides, MD  polyethylene glycol powder (GLYCOLAX/MIRALAX) 17 GM/SCOOP powder Take 17 g by mouth daily. 06/19/23  Yes Glendale Chard, DO  potassium chloride (KLOR-CON)  10 MEQ tablet Take 2 tablets (20 mEq total) by mouth daily. 03/03/23  Yes Prosperi, Christian H, PA-C  rizatriptan (MAXALT-MLT) 5 MG disintegrating tablet Dissolve 1 tablet (5 mg total) in mouth as needed for migraine. May repeat in 2 hours if needed 08/18/23  Yes Levert Feinstein, MD  senna-docusate (SENOKOT-S) 8.6-50 MG tablet Take 1 tablet by mouth daily. 06/19/23  Yes Glendale Chard, DO  verapamil (CALAN-SR) 120 MG CR tablet TAKE 1 TABLET BY MOUTH 2 TIMES DAILY. 08/20/23  Yes [name], DO    Family History Family History  Problem Relation Age of Onset   Hypertension Mother    Diabetes Mother    Heart disease Mother    Thyroid disease Mother    Hypertension Father    Diabetes Father    Hypertension Sister    Thyroid  disease Sister     Social History Social History   Tobacco Use   Smoking status: Some Days   Smokeless tobacco: Never  Vaping Use   Vaping status: Never Used  Substance Use Topics   Alcohol use: Yes    Comment: socially   Drug use: Not Currently     Allergies   Patient has no known allergies.   Review of Systems Review of Systems Per HPI  Physical Exam Triage Vital Signs ED Triage Vitals  Encounter Vitals Group     BP 09/04/23 1623 (!) 140/91     Systolic BP Percentile --      Diastolic BP Percentile --      Pulse Rate 09/04/23 1623 100     Resp 09/04/23 1623 17     Temp 09/04/23 1623 98.9 F (37.2 C)     Temp Source 09/04/23 1623 Oral     SpO2 09/04/23 1623 97 %     Weight 09/04/23 1621 230 lb (104.3 kg)     Height 09/04/23 1621 5\' 6"  (1.676 m)     Head Circumference --      Peak Flow --      Pain Score 09/04/23 1621 6     Pain Loc --      Pain Education --      Exclude from Growth Chart --    No data found.  Updated Vital Signs BP (!) 140/91 (BP Location: Right Arm)   Pulse 100   Temp 98.9 F (37.2 C) (Oral)   Resp 17   Ht 5\' 6"  (1.676 m)   Wt 230 lb (104.3 kg)   LMP 07/28/2023 (Approximate) Comment: lasts 4-10 days  SpO2 97%   BMI 37.12 kg/m   Visual Acuity Right Eye Distance:   Left Eye Distance:   Bilateral Distance:    Right Eye Near:   Left Eye Near:    Bilateral Near:     Physical Exam Vitals and nursing note reviewed.  Constitutional:      Appearance: She is not ill-appearing or toxic-appearing.  HENT:     Head: Normocephalic and atraumatic. No abrasion, masses, right periorbital erythema or left periorbital erythema.     Jaw: There is normal jaw occlusion. Pain on movement present. No trismus or tenderness.      Comments: Facial movements symmetrical. EOMs intact without pain or dizziness. No overlying rash to skin.     Right Ear: Hearing, tympanic membrane, ear canal and external ear normal.     Left Ear: Hearing, tympanic  membrane, ear canal and external ear normal.     Nose: Nose normal.     Mouth/Throat:  Lips: Pink.     Mouth: Mucous membranes are moist. No injury.     Dentition: Abnormal dentition (Multiple areas of decay to the teeth of the left upper mouth). No dental tenderness, gingival swelling or dental abscesses (No palpable/visible dental abscess).     Tongue: No lesions. Tongue does not deviate from midline.     Palate: No mass and lesions.     Pharynx: Oropharynx is clear. Uvula midline. No pharyngeal swelling, oropharyngeal exudate, posterior oropharyngeal erythema or uvula swelling.     Tonsils: No tonsillar exudate or tonsillar abscesses.  Eyes:     General: Lids are normal. Vision grossly intact. Gaze aligned appropriately.     Extraocular Movements: Extraocular movements intact.     Conjunctiva/sclera: Conjunctivae normal.  Pulmonary:     Effort: Pulmonary effort is normal.  Musculoskeletal:     Cervical back: Neck supple. Tenderness present.  Lymphadenopathy:     Cervical: Cervical adenopathy present.  Skin:    General: Skin is warm and dry.     Capillary Refill: Capillary refill takes less than 2 seconds.     Findings: No rash.  Neurological:     General: No focal deficit present.     Mental Status: She is alert and oriented to person, place, and time. Mental status is at baseline.     Cranial Nerves: No dysarthria or facial asymmetry.  Psychiatric:        Mood and Affect: Mood normal.        Speech: Speech normal.        Behavior: Behavior normal.        Thought Content: Thought content normal.        Judgment: Judgment normal.      UC Treatments / Results  Labs (all labs ordered are listed, but only abnormal results are displayed) Labs Reviewed - No data to display  EKG   Radiology No results found.  Procedures Procedures (including critical care time)  Medications Ordered in UC Medications - No data to display  Initial Impression / Assessment and Plan /  UC Course  I have reviewed the triage vital signs and the nursing notes.  Pertinent labs & imaging results that were available during my care of the patient were reviewed by me and considered in my medical decision making (see chart for details).   1. Facial abscess Presentation concerning for facial abscess to the left maxillary space. No red flag signs/symptoms indicating need for referral to ED for advanced imaging/IV antibiotics. Strict ER return precautions discussed. Will manage this with Augmentin BID for 7 days, ibuprofen 800mg  every 8 hours as needed for pain and swelling, and warm compresses as needed.   Counseled patient on potential for adverse effects with medications prescribed/recommended today, strict ER and return-to-clinic precautions discussed, patient verbalized understanding.    Final Clinical Impressions(s) / UC Diagnoses   Final diagnoses:  Facial abscess     Discharge Instructions      Your dental pain is likely due to dental infection. Take  antibiotic as prescribed for the next 7 days to treat your dental infection. Continue use of ibuprofen as needed with food for dental inflammation and pain.   You may also use tylenol as needed for pain. Perform salt water gargles every 3-4 hours.  Schedule an appointment with one of the dentists on the list provided to urgent care today.  If you develop any new or worsening symptoms or if your symptoms do not start to improve, pleases  return here or follow-up with your primary care provider. If your symptoms are severe, please go to the emergency room.    ED Prescriptions     Medication Sig Dispense Auth. Provider   amoxicillin-clavulanate (AUGMENTIN) 875-125 MG tablet Take 1 tablet by mouth every 12 (twelve) hours. 14 tablet Reita May M, FNP   ibuprofen (ADVIL) 800 MG tablet Take 1 tablet (800 mg total) by mouth 3 (three) times daily. 21 tablet Carlisle Beers, FNP      PDMP not reviewed this  encounter.   Carlisle Beers, [place] 09/04/23 1720

## 2023-09-04 NOTE — ED Triage Notes (Signed)
Pt states that she has an abscess of her face (internal) x2 days

## 2023-09-07 ENCOUNTER — Other Ambulatory Visit: Payer: Self-pay

## 2023-09-07 ENCOUNTER — Other Ambulatory Visit (HOSPITAL_COMMUNITY): Payer: Self-pay

## 2023-09-07 MED ORDER — LOSARTAN POTASSIUM 50 MG PO TABS
50.0000 mg | ORAL_TABLET | Freq: Every day | ORAL | 3 refills | Status: DC
  Filled 2023-09-07: qty 90, 90d supply, fill #0

## 2023-09-07 MED FILL — Verapamil HCl Tab ER 120 MG: ORAL | 15 days supply | Qty: 30 | Fill #0 | Status: CN

## 2023-09-07 MED FILL — Verapamil HCl Tab ER 120 MG: ORAL | 75 days supply | Qty: 150 | Fill #0 | Status: CN

## 2023-09-09 ENCOUNTER — Inpatient Hospital Stay: Payer: Medicaid Other

## 2023-09-09 ENCOUNTER — Telehealth: Payer: Self-pay | Admitting: Pharmacy Technician

## 2023-09-09 ENCOUNTER — Other Ambulatory Visit: Payer: Self-pay | Admitting: Medical Oncology

## 2023-09-09 ENCOUNTER — Inpatient Hospital Stay: Payer: Medicaid Other | Attending: Internal Medicine | Admitting: Internal Medicine

## 2023-09-09 VITALS — BP 140/98 | HR 86 | Temp 98.3°F | Resp 18 | Ht 66.0 in | Wt 226.0 lb

## 2023-09-09 DIAGNOSIS — N920 Excessive and frequent menstruation with regular cycle: Secondary | ICD-10-CM | POA: Diagnosis not present

## 2023-09-09 DIAGNOSIS — D508 Other iron deficiency anemias: Secondary | ICD-10-CM

## 2023-09-09 DIAGNOSIS — D500 Iron deficiency anemia secondary to blood loss (chronic): Secondary | ICD-10-CM

## 2023-09-09 LAB — CBC WITH DIFFERENTIAL (CANCER CENTER ONLY)
Abs Immature Granulocytes: 0.02 10*3/uL (ref 0.00–0.07)
Basophils Absolute: 0.1 10*3/uL (ref 0.0–0.1)
Basophils Relative: 1 %
Eosinophils Absolute: 0.2 10*3/uL (ref 0.0–0.5)
Eosinophils Relative: 4 %
HCT: 29.9 % — ABNORMAL LOW (ref 36.0–46.0)
Hemoglobin: 9.2 g/dL — ABNORMAL LOW (ref 12.0–15.0)
Immature Granulocytes: 0 %
Lymphocytes Relative: 40 %
Lymphs Abs: 2.7 10*3/uL (ref 0.7–4.0)
MCH: 21.7 pg — ABNORMAL LOW (ref 26.0–34.0)
MCHC: 30.8 g/dL (ref 30.0–36.0)
MCV: 70.5 fL — ABNORMAL LOW (ref 80.0–100.0)
Monocytes Absolute: 0.5 10*3/uL (ref 0.1–1.0)
Monocytes Relative: 8 %
Neutro Abs: 3.3 10*3/uL (ref 1.7–7.7)
Neutrophils Relative %: 47 %
Platelet Count: 322 10*3/uL (ref 150–400)
RBC: 4.24 MIL/uL (ref 3.87–5.11)
RDW: 19.1 % — ABNORMAL HIGH (ref 11.5–15.5)
WBC Count: 6.8 10*3/uL (ref 4.0–10.5)
nRBC: 0 % (ref 0.0–0.2)

## 2023-09-09 LAB — CMP (CANCER CENTER ONLY)
ALT: 12 U/L (ref 0–44)
AST: 12 U/L — ABNORMAL LOW (ref 15–41)
Albumin: 4 g/dL (ref 3.5–5.0)
Alkaline Phosphatase: 76 U/L (ref 38–126)
Anion gap: 6 (ref 5–15)
BUN: 8 mg/dL (ref 6–20)
CO2: 24 mmol/L (ref 22–32)
Calcium: 8.9 mg/dL (ref 8.9–10.3)
Chloride: 108 mmol/L (ref 98–111)
Creatinine: 0.55 mg/dL (ref 0.44–1.00)
GFR, Estimated: >60 mL/min (ref >60)
Glucose, Bld: 92 mg/dL (ref 70–99)
Potassium: 3.2 mmol/L — ABNORMAL LOW (ref 3.5–5.1)
Sodium: 138 mmol/L (ref 135–145)
Total Bilirubin: 0.3 mg/dL (ref 0.3–1.2)
Total Protein: 7 g/dL (ref 6.5–8.1)

## 2023-09-09 LAB — IRON AND IRON BINDING CAPACITY (CC-WL,HP ONLY)
Iron: 17 ug/dL — ABNORMAL LOW (ref 28–170)
Saturation Ratios: 4 % — ABNORMAL LOW (ref 10.4–31.8)
TIBC: 472 ug/dL — ABNORMAL HIGH (ref 250–450)
UIBC: 455 ug/dL — ABNORMAL HIGH (ref 148–442)

## 2023-09-09 LAB — FERRITIN: Ferritin: 3 ng/mL — ABNORMAL LOW (ref 11–307)

## 2023-09-09 LAB — FOLATE: Folate: 7 ng/mL (ref >5.9)

## 2023-09-09 LAB — VITAMIN B12: Vitamin B-12: 260 pg/mL (ref 180–914)

## 2023-09-09 NOTE — Progress Notes (Signed)
Labs ordered.

## 2023-09-09 NOTE — Telephone Encounter (Signed)
Auth Submission: NO AUTH NEEDED Site of care: Site of care: [facility] Payer: [place] MEDICAID HE[employer] [place]UE Medication & CPT/J Code(s) submitted: Venofer (Iron Sucrose) J1756 Route of submission (phone, fax, portal):  Phone # Fax # Auth type: Buy/Bill PB Units/visits requested: X3 Reference number:  Approval from: 09/09/23 to 12/08/23

## 2023-09-09 NOTE — Progress Notes (Signed)
[facility] [phone]   [phone]  CONSULT NOTE  REFERRING PHYSICIAN: Dr. [name]  REASON FOR CONSULTATION:  37 years old African-American female with severe anemia.  HPI Brittany Graves is a 37 y.o. female with past medical history significant for hypertension, anemia as well as menorrhagia.  The patient was seen recently by her primary care provider complaining of significant fatigue and weakness that has been going on for several months.  She had uterine fibroid and menorrhagia.  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  Has been getting worse recently up to 10 days with heavy clots.  She was seen by her gynecologist and she received Depo-Provera 2 weeks ago with less bleeding.  She started taking over-the-counter ferrous sulfate 1 tablet p.o. daily since early July 2024 but she was not consistent with her medication because of upset stomach.  She has been complaining of intermittent dizzy spells as well as shortness of breath especially when going upstairs.  She also has intermittent diarrhea and craving for ice.  On 08/12/2023 she had repeat CBC that showed hemoglobin of 9.4 and hematocrit 32.4 with normal total white blood count and normal platelet count.  Her MCV was 74.  She had iron studies that showed serum iron of 18 with iron saturation of 4 and ferritin level of 4.  The patient also lost around 20 pounds in the last few months secondary to diarrhea. She has no current chest pain but has shortness of breath with no cough or hemoptysis.  She has no nausea, vomiting but has intermittent diarrhea with no constipation or abdominal pain.  She has no recent headache or visual changes. Family history significant for mother with hypertension, diabetes mellitus, heart disease and thyroid disorder.  Father has hypertension, and diabetes mellitus and sister had fibroid.  The patient is single and has 3 children.  She was accompanied today by her mother and son.   She works as a International aid/development worker at [employer].  She has a history of smoking since age 60 and she smoked for the last 18 years and unfortunately she continues to smoke but try to cut on the number of cigarettes every day.  She also has a history of alcohol abuse but not recently.  She has no history of drug abuse.  HPI  Past Medical History:  Diagnosis Date   Hypertension     Past Surgical History:  Procedure Laterality Date   TUBAL LIGATION      Family History  Problem Relation Age of Onset   Hypertension Mother    Diabetes Mother    Heart disease Mother    Thyroid disease Mother    Hypertension Father    Diabetes Father    Hypertension Sister    Thyroid disease Sister     Social History Social History   Tobacco Use   Smoking status: Some Days   Smokeless tobacco: Never  Vaping Use   Vaping status: Never Used  Substance Use Topics   Alcohol use: Yes    Comment: socially   Drug use: Not Currently    No Known Allergies  Current Outpatient Medications  Medication Sig Dispense Refill   amoxicillin-clavulanate (AUGMENTIN) 875-125 MG tablet Take 1 tablet by mouth every 12 (twelve) hours. 14 tablet 0   ferrous sulfate 324 MG TBEC Take 1 tablet (324 mg total) by mouth daily. 90 tablet 3   ibuprofen (ADVIL) 800 MG tablet Take 1 tablet (800 mg total) by mouth 3 (  three) times daily. 21 tablet 0   losartan (COZAAR) 50 MG tablet Take 1 tablet (50 mg total) by mouth at bedtime. 90 tablet 3   losartan (COZAAR) 50 MG tablet Take 1 tablet (50 mg total) by mouth at bedtime. 90 tablet 3   naproxen (NAPROSYN) 500 MG tablet Take 1 tablet (500 mg total) by mouth 2 (two) times daily as needed (pain). 30 tablet 0   nicotine (NICODERM CQ) 21 mg/24hr patch Place 1 patch (21 mg total) onto the skin daily. 28 patch 0   ondansetron (ZOFRAN-ODT) 4 MG disintegrating tablet Take 1 tablet (4 mg total) by mouth every 8 (eight) hours as needed for nausea or vomiting. 10 tablet 0   polyethylene glycol  powder (GLYCOLAX/MIRALAX) 17 GM/SCOOP powder Take 17 g by mouth daily. 500 g 0   potassium chloride (KLOR-CON) 10 MEQ tablet Take 2 tablets (20 mEq total) by mouth daily. 60 tablet 0   rizatriptan (MAXALT-MLT) 5 MG disintegrating tablet Dissolve 1 tablet (5 mg total) in mouth as needed for migraine. May repeat in 2 hours if needed 10 tablet 6   senna-docusate (SENOKOT-S) 8.6-50 MG tablet Take 1 tablet by mouth daily. 90 tablet 0   verapamil (CALAN-SR) 120 MG CR tablet Take 1 tablet (120 mg total) by mouth 2 (two) times daily. 180 tablet 3   No current facility-administered medications for this visit.    Review of Systems  Constitutional: positive for fatigue and weight loss Eyes: negative Ears, nose, mouth, throat, and face: negative Respiratory: positive for dyspnea on exertion Cardiovascular: negative Gastrointestinal: positive for diarrhea Genitourinary:negative Integument/breast: negative Hematologic/lymphatic: negative Musculoskeletal:negative Neurological: negative Behavioral/Psych: negative Endocrine: negative Allergic/Immunologic: negative  Physical Exam  YQM:VHQIO, healthy, no distress, well nourished, and well developed SKIN: skin color, texture, turgor are normal, no rashes or significant lesions HEAD: Normocephalic, No masses, lesions, tenderness or abnormalities EYES: normal, PERRLA, Conjunctiva are pink and non-injected EARS: External ears normal, Canals clear OROPHARYNX:no exudate, no erythema, and lips, buccal mucosa, and tongue normal  NECK: supple, no adenopathy, no JVD LYMPH:  no palpable lymphadenopathy, no hepatosplenomegaly BREAST:not examined LUNGS: clear to auscultation , and palpation HEART: regular rate & rhythm, no murmurs, and no gallops ABDOMEN:abdomen soft, non-tender, obese, normal bowel sounds, and no masses or organomegaly BACK: Back symmetric, no curvature. EXTREMITIES:no joint deformities, effusion, or inflammation, no edema  NEURO: alert &  oriented x 3 with fluent speech, no focal motor/sensory deficits  PERFORMANCE STATUS: ECOG 1  LABORATORY DATA: Lab Results  Component Value Date   WBC 6.8 09/09/2023   HGB 9.2 (L) 09/09/2023   HCT 29.9 (L) 09/09/2023   MCV 70.5 (L) 09/09/2023   PLT 322 09/09/2023      Chemistry      Component Value Date/Time   NA 139 06/18/2023 1642   NA 139 05/15/2012 0853   K 3.3 (L) 06/18/2023 1642   K 3.3 (L) 05/15/2012 0853   CL 104 06/18/2023 1642   CL 104 05/15/2012 0853   CO2 23 06/18/2023 1637   CO2 24 05/15/2012 0853   BUN 3 (L) 06/18/2023 1642   BUN 4 (L) 05/15/2012 0853   CREATININE 0.60 06/18/2023 1642   CREATININE 0.73 05/15/2012 0853      Component Value Date/Time   CALCIUM 9.1 06/18/2023 1637   CALCIUM 9.0 05/15/2012 0853   ALKPHOS 80 06/18/2023 1637   AST 17 06/18/2023 1637   ALT 16 06/18/2023 1637   BILITOT 0.5 06/18/2023 1637  RADIOGRAPHIC STUDIES: No results found.  ASSESSMENT: This is a very pleasant 37 years old African-American female with significant iron deficiency anemia secondary to menorrhagia secondary to uterine fibroid.  She has intolerance to the oral iron tablets.   PLAN: I had a lengthy discussion with the patient and her family today about her current condition and further investigation to confirm her diagnosis as well as possible treatment options. I ordered several studies today including repeat CBC that showed normal total white blood count of 6.8 but the patient had low hemoglobin of 9.2 and hematocrit 29.9% and MCV of 70.5.  Comprehensive metabolic panel is unremarkable except for low serum potassium of 3.2.  Iron study showed persistent low serum iron of 17 with iron saturation of 4% and TIBC of 472.  Ferritin level, vitamin B12, serum folate and serum protein electrophoresis are still pending. Because of her intolerance to the oral iron supplement, I recommended for the patient to consider iron infusion with Venofer 300 Mg IV weekly for 3  weeks.  She was also advised to increase her iron rich diet and if she is able to take the oral iron supplement is to take it with vitamin C or orange juice. I will see the patient back for follow-up visit in 2 months for evaluation and repeat blood work. She was advised to call immediately if she has any other concerning symptoms in the interval. The patient voices understanding of current disease status and treatment options and is in agreement with the current care plan.  All questions were answered. The patient knows to call the clinic with any problems, questions or concerns. We can certainly see the patient much sooner if necessary.  Thank you so much for allowing me to participate in the care of Jamielyn L Heitmeyer. I will continue to follow up the patient with you and assist in her care.  The total time spent in the appointment was 60 minutes.  Disclaimer: This note was dictated with voice recognition software. Similar sounding words can inadvertently be transcribed and may not be corrected upon review.   Lajuana Matte September 09, 2023, 12:16 PM

## 2023-09-15 ENCOUNTER — Ambulatory Visit: Payer: Medicaid Other

## 2023-09-15 VITALS — BP 142/93 | HR 75 | Temp 98.5°F | Resp 16 | Ht 65.0 in | Wt 225.4 lb

## 2023-09-15 DIAGNOSIS — D500 Iron deficiency anemia secondary to blood loss (chronic): Secondary | ICD-10-CM | POA: Diagnosis not present

## 2023-09-15 DIAGNOSIS — N920 Excessive and frequent menstruation with regular cycle: Secondary | ICD-10-CM

## 2023-09-15 MED ORDER — DIPHENHYDRAMINE HCL 25 MG PO CAPS
25.0000 mg | ORAL_CAPSULE | Freq: Once | ORAL | Status: AC
  Administered 2023-09-15: 25 mg via ORAL
  Filled 2023-09-15: qty 1

## 2023-09-15 MED ORDER — SODIUM CHLORIDE 0.9 % IV SOLN
300.0000 mg | INTRAVENOUS | Status: DC
  Administered 2023-09-15: 300 mg via INTRAVENOUS
  Filled 2023-09-15: qty 15

## 2023-09-15 MED ORDER — ACETAMINOPHEN 325 MG PO TABS
650.0000 mg | ORAL_TABLET | Freq: Once | ORAL | Status: AC
  Administered 2023-09-15: 650 mg via ORAL
  Filled 2023-09-15: qty 2

## 2023-09-15 NOTE — Patient Instructions (Addendum)
Excuse from Work, [employer], or Physical Activity __Cristy Harris_ needs to be excused from: __X__ Work. ____ School. ____ Physical activity. This is effective for the following dates: _10/08/2024__. He or she may return to work or school, but should avoid physical activity or other activities from now until ______10/09/2024_____. Activity restrictions include: ___ Lifting more than _________ lb / kg. ____ Sitting longer than __________ minutes at a time. ___ Standing longer than ________ minutes at a time. ___ Other activities including: ___________________________________________________________________________________ ___ He or she may return to full physical activity on _____10/09/2024_. Health care provider name (printed): _____________________________________________________ Health care provider (signature): _________________________________________________________ Date: _______10/08/2024_______________________ This information is not intended to replace advice given to you by your health care provider. Make sure you discuss any questions you have with your health care provider. Document Revised: 08/14/2021 Document Reviewed: 08/14/2021 Elsevier Patient Education  2024 Elsevier Inc. Iron Sucrose Injection What is this medication? IRON SUCROSE (EYE ern SOO krose) treats low levels of iron (iron deficiency anemia) in people with kidney disease. Iron is a mineral that plays an important role in making red blood cells, which carry oxygen from your lungs to the rest of your body. This medicine may be used for other purposes; ask your health care provider or pharmacist if you have questions. COMMON BRAND NAME(S): Venofer What should I tell my care team before I take this medication? They need to know if you have any of these conditions: Anemia not caused by low iron levels Heart disease High levels of iron in the blood Kidney disease Liver disease An unusual or allergic reaction to iron,  other medications, foods, dyes, or preservatives Pregnant or trying to get pregnant Breastfeeding How should I use this medication? This medication is for infusion into a vein. It is given in a hospital or clinic setting. Talk to your care team about the use of this medication in children. While this medication may be prescribed for children as young as 2 years for selected conditions, precautions do apply. Overdosage: If you think you have taken too much of this medicine contact a poison control center or emergency room at once. NOTE: This medicine is only for you. Do not share this medicine with others. What if I miss a dose? Keep appointments for follow-up doses. It is important not to miss your dose. Call your care team if you are unable to keep an appointment. What may interact with this medication? Do not take this medication with any of the following: Deferoxamine Dimercaprol Other iron products This medication may also interact with the following: Chloramphenicol Deferasirox This list may not describe all possible interactions. Give your health care provider a list of all the medicines, herbs, non-prescription drugs, or dietary supplements you use. Also tell them if you smoke, drink alcohol, or use illegal drugs. Some items may interact with your medicine. What should I watch for while using this medication? Visit your care team regularly. Tell your care team if your symptoms do not start to get better or if they get worse. You may need blood work done while you are taking this medication. You may need to follow a special diet. Talk to your care team. Foods that contain iron include: whole grains/cereals, dried fruits, beans, or peas, leafy green vegetables, and organ meats (liver, kidney). What side effects may I notice from receiving this medication? Side effects that you should report to your care team as soon as possible: Allergic reactions--skin rash, itching, hives, swelling of  the face,  lips, tongue, or throat Low blood pressure--dizziness, feeling faint or lightheaded, blurry vision Shortness of breath Side effects that usually do not require medical attention (report to your care team if they continue or are bothersome): Flushing Headache Joint pain Muscle pain Nausea Pain, redness, or irritation at injection site This list may not describe all possible side effects. Call your doctor for medical advice about side effects. You may report side effects to FDA at 1-800-FDA-1088. Where should I keep my medication? This medication is given in a hospital or clinic. It will not be stored at home. NOTE: This sheet is a summary. It may not cover all possible information. If you have questions about this medicine, talk to your doctor, pharmacist, or health care provider.  2024 Elsevier/Gold Standard (2023-05-01 00:00:00)

## 2023-09-15 NOTE — Progress Notes (Signed)
Diagnosis: Iron Deficiency Anemia  Provider:  Chilton Greathouse MD  Procedure: IV Infusion  IV Type: Peripheral, IV Location: R Forearm  Venofer (Iron Sucrose), Dose: 300 mg  Infusion Start Time: 1158  Infusion Stop Time: 1342  Post Infusion IV Care: Observation period completed and Peripheral IV Discontinued  Discharge: Condition: Good, Destination: Home . AVS Provided  Performed by:  Adriana Mccallum, RN

## 2023-09-16 LAB — PROTEIN ELECTROPHORESIS, SERUM, WITH REFLEX
A/G Ratio: 1 (ref 0.7–1.7)
Albumin ELP: 3.4 g/dL (ref 2.9–4.4)
Alpha-1-Globulin: 0.3 g/dL (ref 0.0–0.4)
Alpha-2-Globulin: 0.7 g/dL (ref 0.4–1.0)
Beta Globulin: 1.1 g/dL (ref 0.7–1.3)
Gamma Globulin: 1.3 g/dL (ref 0.4–1.8)
Globulin, Total: 3.3 g/dL (ref 2.2–3.9)
M-Spike, %: 0.6 g/dL — ABNORMAL HIGH
SPEP Interpretation: 0
Total Protein ELP: 6.7 g/dL (ref 6.0–8.5)

## 2023-09-16 LAB — IMMUNOFIXATION REFLEX, SERUM
IgA: 158 mg/dL (ref 87–352)
IgG (Immunoglobin G), Serum: 1173 mg/dL (ref 586–1602)
IgM (Immunoglobulin M), Srm: 118 mg/dL (ref 26–217)

## 2023-09-17 ENCOUNTER — Other Ambulatory Visit (HOSPITAL_COMMUNITY): Payer: Self-pay

## 2023-09-22 ENCOUNTER — Ambulatory Visit: Payer: Medicaid Other | Admitting: *Deleted

## 2023-09-22 VITALS — BP 141/85 | HR 83 | Temp 98.4°F | Resp 18 | Ht 65.0 in | Wt 226.2 lb

## 2023-09-22 DIAGNOSIS — D500 Iron deficiency anemia secondary to blood loss (chronic): Secondary | ICD-10-CM | POA: Diagnosis not present

## 2023-09-22 DIAGNOSIS — N920 Excessive and frequent menstruation with regular cycle: Secondary | ICD-10-CM

## 2023-09-22 MED ORDER — ACETAMINOPHEN 325 MG PO TABS
650.0000 mg | ORAL_TABLET | Freq: Once | ORAL | Status: AC
  Administered 2023-09-22: 650 mg via ORAL
  Filled 2023-09-22: qty 2

## 2023-09-22 MED ORDER — SODIUM CHLORIDE 0.9 % IV SOLN
300.0000 mg | INTRAVENOUS | Status: DC
  Administered 2023-09-22: 300 mg via INTRAVENOUS
  Filled 2023-09-22: qty 15

## 2023-09-22 MED ORDER — DIPHENHYDRAMINE HCL 25 MG PO CAPS
25.0000 mg | ORAL_CAPSULE | Freq: Once | ORAL | Status: AC
  Administered 2023-09-22: 25 mg via ORAL
  Filled 2023-09-22: qty 1

## 2023-09-22 NOTE — Progress Notes (Signed)
Diagnosis: Iron Deficiency Anemia  Provider:  Chilton Greathouse MD  Procedure: IV Infusion  IV Type: Peripheral, IV Location: R Forearm  Venofer (Iron Sucrose), Dose: 300 mg  Infusion Start Time: 1155 am  Infusion Stop Time: 1340 pm  Post Infusion IV Care: Observation period completed and Peripheral IV Discontinued  Discharge: Condition: Good, Destination: Home . AVS Declined  Performed by:  Forrest Moron, RN

## 2023-09-29 ENCOUNTER — Ambulatory Visit: Payer: Medicaid Other

## 2023-09-29 VITALS — BP 115/76 | HR 93 | Temp 98.4°F | Resp 16 | Ht 65.0 in | Wt 226.2 lb

## 2023-09-29 DIAGNOSIS — N920 Excessive and frequent menstruation with regular cycle: Secondary | ICD-10-CM | POA: Diagnosis not present

## 2023-09-29 DIAGNOSIS — D500 Iron deficiency anemia secondary to blood loss (chronic): Secondary | ICD-10-CM | POA: Diagnosis not present

## 2023-09-29 MED ORDER — ACETAMINOPHEN 325 MG PO TABS
650.0000 mg | ORAL_TABLET | Freq: Once | ORAL | Status: AC
  Administered 2023-09-29: 650 mg via ORAL
  Filled 2023-09-29: qty 2

## 2023-09-29 MED ORDER — SODIUM CHLORIDE 0.9 % IV SOLN
300.0000 mg | INTRAVENOUS | Status: DC
Start: 2023-09-29 — End: 2023-09-29
  Administered 2023-09-29: 300 mg via INTRAVENOUS

## 2023-09-29 MED ORDER — DIPHENHYDRAMINE HCL 25 MG PO CAPS
25.0000 mg | ORAL_CAPSULE | Freq: Once | ORAL | Status: AC
  Administered 2023-09-29: 25 mg via ORAL
  Filled 2023-09-29: qty 1

## 2023-09-29 NOTE — Progress Notes (Signed)
Diagnosis: Iron Deficiency Anemia  Provider:  Chilton Greathouse MD  Procedure: IV Infusion  IV Type: Peripheral, IV Location: R Forearm  Venofer (Iron Sucrose), Dose: 300 mg  Infusion Start Time: 1001  Infusion Stop Time: 1147  Post Infusion IV Care: Patient declined observation and Peripheral IV Discontinued  Discharge: Condition: Good, Destination: Home . AVS Declined  Performed by:  Adriana Mccallum, RN

## 2023-10-01 ENCOUNTER — Other Ambulatory Visit (HOSPITAL_COMMUNITY): Payer: Self-pay

## 2023-10-02 ENCOUNTER — Encounter: Payer: Self-pay | Admitting: Obstetrics and Gynecology

## 2023-10-02 ENCOUNTER — Ambulatory Visit (INDEPENDENT_AMBULATORY_CARE_PROVIDER_SITE_OTHER): Payer: Medicaid Other | Admitting: Obstetrics and Gynecology

## 2023-10-02 ENCOUNTER — Other Ambulatory Visit: Payer: Self-pay

## 2023-10-02 ENCOUNTER — Other Ambulatory Visit (HOSPITAL_COMMUNITY)
Admission: RE | Admit: 2023-10-02 | Discharge: 2023-10-02 | Disposition: A | Discharge status: Home / Self Care | Payer: Medicaid Other | Source: Ambulatory Visit | Attending: Obstetrics and Gynecology | Admitting: Obstetrics and Gynecology

## 2023-10-02 VITALS — BP 154/101 | HR 80

## 2023-10-02 DIAGNOSIS — N83292 Other ovarian cyst, left side: Secondary | ICD-10-CM | POA: Insufficient documentation

## 2023-10-02 DIAGNOSIS — N921 Excessive and frequent menstruation with irregular cycle: Secondary | ICD-10-CM | POA: Insufficient documentation

## 2023-10-02 DIAGNOSIS — R87810 Cervical high risk human papillomavirus (HPV) DNA test positive: Secondary | ICD-10-CM | POA: Insufficient documentation

## 2023-10-02 DIAGNOSIS — R87610 Atypical squamous cells of undetermined significance on cytologic smear of cervix (ASC-US): Secondary | ICD-10-CM | POA: Diagnosis present

## 2023-10-02 DIAGNOSIS — Z1331 Encounter for screening for depression: Secondary | ICD-10-CM

## 2023-10-02 HISTORY — PX: COLPOSCOPY: PRO47

## 2023-10-02 LAB — POCT PREGNANCY, URINE: Preg Test, Ur: NEGATIVE

## 2023-10-02 NOTE — Progress Notes (Signed)
Obstetrics and Gynecology Visit Return Patient Evaluation  Appointment Date: 10/02/2023  Primary Care Provider: Fortunato Graves  OBGYN Clinic: [facility]   Chief Complaint: colposcopy, AUB with depo provera  History of Present Illness:  Brittany Graves is a 37 y.o. here for colposcopy. She got depo for heavy, painful periods at her 9/4 new patient visit. ASCUS/HPV+, 16/18/45 negative at that visit so patient here for colposcopy today.   Interval History:  She also states that she has had AUB nearly daily (light, non painful) since the depo provera; she did not have any intermenstrual bleeding prior to this.  Review of Systems: as noted in the History of Present Illness.  Patient Active Problem List   Diagnosis Date Noted   Complex cyst of left ovary 10/02/2023   Breakthrough bleeding on depo provera 10/02/2023   ASCUS with positive high risk HPV cervical 08/20/2023   Chronic migraine w/o aura w/o status migrainosus, not intractable 08/18/2023   Intramural leiomyoma of uterus 08/13/2023   Dysmenorrhea 08/12/2023   Menorrhagia 07/09/2023   Work stress 07/09/2023   Iron deficiency anemia 06/19/2023   Encounter for smoking cessation counseling 06/19/2023   HTN (hypertension) 06/18/2023   Headache 06/18/2023   Stroke (HCC) 06/18/2023   Weakness 06/18/2023   Medications:  Brittany Graves had no medications administered during this visit. Current Outpatient Medications  Medication Sig Dispense Refill   amoxicillin-clavulanate (AUGMENTIN) 875-125 MG tablet Take 1 tablet by mouth every 12 (twelve) hours. 14 tablet 0   ferrous sulfate 324 MG TBEC Take 1 tablet (324 mg total) by mouth daily. 90 tablet 3   ibuprofen (ADVIL) 800 MG tablet Take 1 tablet (800 mg total) by mouth 3 (three) times daily. 21 tablet 0   losartan (COZAAR) 50 MG tablet Take 1 tablet (50 mg total) by mouth at bedtime. 90 tablet 3   losartan (COZAAR) 50 MG tablet Take 1 tablet  (50 mg total) by mouth at bedtime. 90 tablet 3   naproxen (NAPROSYN) 500 MG tablet Take 1 tablet (500 mg total) by mouth 2 (two) times daily as needed (pain). 30 tablet 0   nicotine (NICODERM CQ) 21 mg/24hr patch Place 1 patch (21 mg total) onto the skin daily. 28 patch 0   ondansetron (ZOFRAN-ODT) 4 MG disintegrating tablet Take 1 tablet (4 mg total) by mouth every 8 (eight) hours as needed for nausea or vomiting. 10 tablet 0   polyethylene glycol powder (GLYCOLAX/MIRALAX) 17 GM/SCOOP powder Take 17 g by mouth daily. 500 g 0   potassium chloride (KLOR-CON) 10 MEQ tablet Take 2 tablets (20 mEq total) by mouth daily. 60 tablet 0   rizatriptan (MAXALT-MLT) 5 MG disintegrating tablet Dissolve 1 tablet (5 mg total) in mouth as needed for migraine. May repeat in 2 hours if needed 10 tablet 6   senna-docusate (SENOKOT-S) 8.6-50 MG tablet Take 1 tablet by mouth daily. 90 tablet 0   verapamil (CALAN-SR) 120 MG CR tablet Take 1 tablet (120 mg total) by mouth 2 (two) times daily. 180 tablet 3   No current facility-administered medications for this visit.    Allergies: has No Known Allergies.  Physical Exam:  BP (!) 154/101   Pulse 80  There is no height or weight on file to calculate BMI. General appearance: Well nourished, well developed female in no acute distress.  Neuro/Psych:  Normal mood and affect.    Pelvic exam:  See colpo note for details but small amount of old blood  in the vault, two biopsies done  Assessment: patient stable  Plan:  1. ASCUS with positive high risk HPV cervical Follow up colpo - Surgical pathology( / [facility])  2. Breakthrough bleeding on depo provera S/s and exam c/w bleeding due to depo provera. Pt seen by Heme and s/p IV iron infusions x 3 with follow up early December and she has seen neurology who believes her ?TIA is more likely migraine related. I told her that recommend seeing her when she is due for another depo. If bleeding improves then  recommend trying another 3 months of depo but if no improvement, then can talk to her about other medical options vs surgical options  3. LO cyst Repeat u/s in 6-12 months  4. Heme Patient unsure of significance of hematology labs. Will CC them to see if they can touch base with patient prior to their next visit  Future Appointments  Date Time Provider Department Center  10/28/2023  1:15 PM Brittany Bing, MD [facility]  11/16/2023  8:45 AM [facility] None  11/16/2023  9:15 AM Brittany Gaul, MD [facility] None    Brittany Copa MD Attending Center for [facility])

## 2023-10-02 NOTE — Procedures (Addendum)
Colposcopy Procedure Note  Pre-operative Diagnosis: 08/12/2023 ASCUS/HPV+, 16/18/45 negative. No prior pap history available. Depo provera AUB  Post-operative Diagnosis: CIN 1  Procedure Details  Urine pregnancy test: negative. Cervical exam performed in the presence of a chaperone The risks (including infection, bleeding, pain) and benefits of the procedure were explained to the patient and written informed consent was obtained.  The patient was placed in the dorsal lithotomy position. A Graves was speculum inserted in the vagina, and the cervix was visualized with aid of a tenaculum; small amount of old blood seen in the vaginal vault.  Acetic acid staining was done and the cervix was viewed with green filter  Biopsy from 6 and 12 o'clock and then single toothed tenaculum applied and endocervical curettage in all four quadrants done. There was no bleeding after procedure.   Findings: mild diffuse and circumferential AWE changes  Adequate: yes  Specimens: 6 and 12 o'clock  Condition: Stable  Complications: None  Plan: Patient recommended to avoid tobacco/second-hand smoke.  The patient was advised to call for any fever or for prolonged or severe pain or bleeding. She was advised to use OTC analgesics as needed for mild to moderate pain. She was advised to avoid vaginal intercourse for 48 hours or until the bleeding has completely stopped.   Cornelia Copa MD Attending Center for [facility])

## 2023-10-05 LAB — SURGICAL PATHOLOGY

## 2023-10-14 ENCOUNTER — Other Ambulatory Visit (HOSPITAL_COMMUNITY): Payer: Self-pay

## 2023-10-14 ENCOUNTER — Other Ambulatory Visit: Payer: Self-pay

## 2023-10-14 MED FILL — Verapamil HCl Tab ER 120 MG: ORAL | 90 days supply | Qty: 180 | Fill #0 | Status: CN

## 2023-10-16 ENCOUNTER — Telehealth: Payer: Self-pay | Admitting: Internal Medicine

## 2023-10-16 NOTE — Telephone Encounter (Signed)
Called patient regarding upcoming December appointments, left a voicemail. 

## 2023-10-23 ENCOUNTER — Ambulatory Visit (HOSPITAL_COMMUNITY): Payer: Medicaid Other

## 2023-10-24 ENCOUNTER — Encounter (HOSPITAL_COMMUNITY): Payer: Self-pay

## 2023-10-24 ENCOUNTER — Ambulatory Visit (HOSPITAL_COMMUNITY)
Admission: RE | Admit: 2023-10-24 | Discharge: 2023-10-24 | Disposition: A | Discharge status: Home / Self Care | Payer: Medicaid Other | Source: Ambulatory Visit | Attending: Family Medicine | Admitting: Family Medicine

## 2023-10-24 VITALS — BP 137/100 | HR 93 | Temp 98.1°F | Resp 16

## 2023-10-24 DIAGNOSIS — L02411 Cutaneous abscess of right axilla: Secondary | ICD-10-CM

## 2023-10-24 MED ORDER — MORPHINE SULFATE (PF) 2 MG/ML IV SOLN
INTRAVENOUS | Status: AC
  Filled 2023-10-24: qty 2

## 2023-10-24 MED ORDER — MORPHINE SULFATE (PF) 2 MG/ML IV SOLN
4.0000 mg | Freq: Once | INTRAVENOUS | Status: AC
  Administered 2023-10-24: 4 mg via INTRAMUSCULAR

## 2023-10-24 MED ORDER — DOXYCYCLINE HYCLATE 100 MG PO CAPS: 100.0000 mg | ORAL_CAPSULE | Freq: Two times a day (BID) | ORAL | 0 refills | Status: DC

## 2023-10-24 MED ORDER — HYDROCODONE-ACETAMINOPHEN 5-325 MG PO TABS: 1.0000 | ORAL_TABLET | Freq: Four times a day (QID) | ORAL | 0 refills | Status: DC | PRN

## 2023-10-24 NOTE — ED Notes (Signed)
Patient sates she had someone coming to drive her and her car home.

## 2023-10-24 NOTE — ED Provider Notes (Signed)
[facility]   [id number] 10/24/23 Arrival Time: 1433  ASSESSMENT & PLAN:  1. Abscess of axilla, right     Incision and Drainage Procedure Note  Anesthesia: 1% plain lidocaine  Procedure Details  The procedure, risks and complications have been discussed in detail (including, but not limited to pain and bleeding) with the patient.  The skin induration was prepped and draped in the usual fashion. After adequate local anesthesia, I&D with a #11 blade was performed on the largest induration of the right axilla with bloody, purulent drainage.  EBL: minimal Drains: none Packing: 1/4" iodoform Condition: Tolerated procedure well Complications: none.  Meds ordered this encounter  Medications   morphine (PF) 2 MG/ML injection 4 mg   HYDROcodone-acetaminophen (NORCO/VICODIN) 5-325 MG tablet    Sig: Take 1 tablet by mouth every 6 (six) hours as needed for moderate pain (pain score 4-6) or severe pain (pain score 7-10).    Dispense:  8 tablet    Refill:  0   doxycycline (VIBRAMYCIN) 100 MG capsule    Sig: Take 1 capsule (100 mg total) by mouth 2 (two) times daily.    Dispense:  14 capsule    Refill:  0   Wound care instructions discussed. To return in 48 hours for wound check and packing removal.  Finish all antibiotics. OTC analgesics as needed.  Reviewed expectations re: course of current medical issues. Questions answered. Outlined signs and symptoms indicating need for more acute intervention. Patient verbalized understanding. After Visit Summary given.   SUBJECTIVE:  Brittany Graves is a 37 y.o. female who presents with a possible infection of her R axilla. Onset gradual, approximately a few days ago without active drainage and without active bleeding. Symptoms have gradually worsened (pain) since beginning. Fever: denies. No tx PTA.   OBJECTIVE:  Vitals:   10/24/23 1455  BP: (!) 137/100  Pulse: 93  Resp: 16  Temp: 98.1 F (36.7 C)  TempSrc: Oral   SpO2: 97%     General appearance: alert; no distress R axilla: several areas of skin thickening identified with the largest measuring approx 3x4 cm; very tender to touch; no active drainage or bleeding Psychological: alert and cooperative; normal mood and affect  No Known Allergies  Past Medical History:  Diagnosis Date   Hypertension    Social History   Socioeconomic History   Marital status: Single    Spouse name: Not on file   Number of children: Not on file   Years of education: Not on file   Highest education level: Not on file  Occupational History   Not on file  Tobacco Use   Smoking status: Some Days    Types: Cigarettes   Smokeless tobacco: Never  Vaping Use   Vaping status: Never Used  Substance and Sexual Activity   Alcohol use: Yes    Comment: socially   Drug use: Not Currently   Sexual activity: Yes    Birth control/protection: Surgical, Condom    Comment: BTL in 2011  Other Topics Concern   Not on file  Social History Narrative   Not on file   Social Determinants of Health   Financial Resource Strain: Not on file  Food Insecurity: Food Insecurity Present (08/12/2023)   Hunger Vital Sign    Worried About Running Out of Food in the Last Year: Often true    Ran Out of Food in the Last Year: Sometimes true  Transportation Needs: No Transportation Needs (08/12/2023)   PRAPARE -  Administrator, Civil Service (Medical): No    Lack of Transportation (Non-Medical): No  Physical Activity: Not on file  Stress: Not on file  Social Connections: Not on file   Family History  Problem Relation Age of Onset   Hypertension Mother    Diabetes Mother    Heart disease Mother    Thyroid disease Mother    Hypertension Father    Diabetes Father    Hypertension Sister    Thyroid disease Sister    Past Surgical History:  Procedure Laterality Date   COLPOSCOPY  10/02/2023   TUBAL LIGATION              Mardella Layman, MD 10/24/23 1725

## 2023-10-24 NOTE — Discharge Instructions (Signed)
Meds ordered this encounter  Medications   morphine (PF) 2 MG/ML injection 4 mg   HYDROcodone-acetaminophen (NORCO/VICODIN) 5-325 MG tablet    Sig: Take 1 tablet by mouth every 6 (six) hours as needed for moderate pain (pain score 4-6) or severe pain (pain score 7-10).    Dispense:  8 tablet    Refill:  0   doxycycline (VIBRAMYCIN) 100 MG capsule    Sig: Take 1 capsule (100 mg total) by mouth 2 (two) times daily.    Dispense:  14 capsule    Refill:  0   Be aware, you have been prescribed pain medications that may cause drowsiness. While taking this medication, do not take any other medications containing acetaminophen (Tylenol). Do not combine with alcohol or recreational drugs. Please do not drive, operate heavy machinery, or take part in activities that require making important decisions while on this medication as your judgement may be clouded.

## 2023-10-24 NOTE — ED Triage Notes (Signed)
Patient c/o several abscesses of he right axilla x 4 days.  Patient denies taking anything for pain.

## 2023-10-26 ENCOUNTER — Other Ambulatory Visit (HOSPITAL_COMMUNITY): Payer: Self-pay

## 2023-10-28 ENCOUNTER — Ambulatory Visit: Payer: Medicaid Other | Admitting: Obstetrics and Gynecology

## 2023-11-11 ENCOUNTER — Telehealth: Payer: Self-pay | Admitting: Medical Oncology

## 2023-11-11 ENCOUNTER — Inpatient Hospital Stay: Payer: Medicaid Other | Admitting: Internal Medicine

## 2023-11-11 ENCOUNTER — Inpatient Hospital Stay: Payer: Medicaid Other | Attending: Internal Medicine

## 2023-11-11 NOTE — Telephone Encounter (Signed)
She said she called last week to cancel her appt for today and no one called her back. She wants to r/s. Schedule message sent.

## 2023-11-16 ENCOUNTER — Other Ambulatory Visit: Payer: Medicaid Other

## 2023-11-16 ENCOUNTER — Ambulatory Visit: Payer: Medicaid Other | Admitting: Internal Medicine

## 2023-11-23 ENCOUNTER — Telehealth: Payer: Self-pay | Admitting: Physician Assistant

## 2023-11-23 NOTE — Telephone Encounter (Signed)
Left patient and patient's mother a voicemail in regards to scheduled appointment times/dates

## 2023-12-15 NOTE — Progress Notes (Signed)
 [facility] OFFICE PROGRESS NOTE  Janna Ferrier, DO [address] [place] [id number]  DIAGNOSIS: 1)  iron  deficiency anemia secondary to menorrhagia secondary to uterine fibroid. She has intolerance to the oral iron  tablets.  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   CURRENT THERAPY: IV iron  with Venofer  300 mg x3 last dose on 09/29/2023  INTERVAL HISTORY: Brittany Graves 38 y.o. female returns to the clinic today for a follow-up visit.  The patient establish care in the clinic with Dr. Sherrod on 09/09/2023.  The patient is followed for anemia secondary to menorrhagia secondary to uterine fibroids.  She has associated heavy clots with her menstrual periods.  She is followed closely by GYN and has previously received Depo-Provera  with less bleeding.  However she believes she only received 1 injection in the interval since last being seen.  She believes she may have been supposed to get another injection but is not sure when that should be.    She is feeling a lot better since she was last seen.  Her energy has improved and she is no longer craving ice chips.  She is also no longer sleeping most of the day as she was previously.  She denies any abnormal bleeding or bruising besides the heavy menstrual bleeding.  The patient tried taking over-the-counter ferrous sulfate  in early July 2024 but she was not consistent with this due to GI upset.  She has been trying to increase her dietary intake of iron  rich food such as liver.   The patient has a history of hypertension.  Per chart review the patient has losartan . She states that she ran out of this several weeks ago and that she has been having a challenging time getting in touch with her PCPs office. They are in epic and I do not see any appointment note. From my end it looks like she has not picked up any of her refills that they sent in August. She denies any symptoms  of hypertension such as headaches, vision changes, chest pain, or dizziness.  She does not have a blood pressure cuff at home.  The patient also is wondering today if we do blood work for cancer screening. I did see in the chart she did have an M spike on her SPEP from October.   She is here today for evaluation repeat blood work.    MEDICAL HISTORY: Past Medical History:  Diagnosis Date   Hypertension     ALLERGIES:  has no known allergies.  MEDICATIONS:  Current Outpatient Medications  Medication Sig Dispense Refill   doxycycline  (VIBRAMYCIN ) 100 MG capsule Take 1 capsule (100 mg total) by mouth 2 (two) times daily. 14 capsule 0   HYDROcodone -acetaminophen  (NORCO/VICODIN) 5-325 MG tablet Take 1 tablet by mouth every 6 (six) hours as needed for moderate pain (pain score 4-6) or severe pain (pain score 7-10). 8 tablet 0   losartan  (COZAAR ) 50 MG tablet Take 1 tablet (50 mg total) by mouth at bedtime. 90 tablet 3   losartan  (COZAAR ) 50 MG tablet Take 1 tablet (50 mg total) by mouth at bedtime. 90 tablet 3   nicotine  (NICODERM CQ ) 21 mg/24hr patch Place 1 patch (21 mg total) onto the skin daily. 28 patch 0   polyethylene glycol powder (GLYCOLAX /MIRALAX ) 17 GM/SCOOP powder Take 17 g by mouth daily. 500 g 0   verapamil  (CALAN -SR) 120 MG CR tablet Take 1 tablet (120  mg total) by mouth 2 (two) times daily. 180 tablet 3   No current facility-administered medications for this visit.    SURGICAL HISTORY:  Past Surgical History:  Procedure Laterality Date   COLPOSCOPY  10/02/2023   TUBAL LIGATION      REVIEW OF SYSTEMS:   Review of Systems  Constitutional: Negative for appetite change, chills, fatigue, fever and unexpected weight change.  HENT:   Negative for mouth sores, nosebleeds, sore throat and trouble swallowing.   Eyes: Negative for eye problems and icterus.  Respiratory: Negative for cough, hemoptysis, shortness of breath and wheezing.   Cardiovascular: Negative for chest  pain and leg swelling.  Gastrointestinal: Negative for abdominal pain, constipation, diarrhea, nausea and vomiting.  Genitourinary: Positive for menorragia. Negative for bladder incontinence, difficulty urinating, dysuria, frequency and hematuria.   Musculoskeletal: Negative for back pain, gait problem, neck pain and neck stiffness.  Skin: Negative for itching and rash.  Neurological: Negative for dizziness, extremity weakness, gait problem, headaches, light-headedness and seizures.  Hematological: Negative for adenopathy. Does not bruise/bleed easily.  Psychiatric/Behavioral: Negative for confusion, depression and sleep disturbance. The patient is not nervous/anxious.     PHYSICAL EXAMINATION:  There were no vitals taken for this visit.  ECOG PERFORMANCE STATUS:   Physical Exam  Constitutional: Oriented to person, place, and time and well-developed, well-nourished, and in no distress.  HENT:  Head: Normocephalic and atraumatic.  Mouth/Throat: Oropharynx is clear and moist. No oropharyngeal exudate.  Eyes: Conjunctivae are normal. Right eye exhibits no discharge. Left eye exhibits no discharge. No scleral icterus.  Neck: Normal range of motion. Neck supple.  Cardiovascular: Normal rate, regular rhythm, normal heart sounds and intact distal pulses.   Pulmonary/Chest: Effort normal and breath sounds normal. No respiratory distress. No wheezes. No rales.  Abdominal: Soft. Bowel sounds are normal. Exhibits no distension and no mass. There is no tenderness.  Musculoskeletal: Normal range of motion. Exhibits no edema.  Lymphadenopathy:    No cervical adenopathy.  Neurological: Alert and oriented to person, place, and time. Exhibits normal muscle tone. Gait normal. Coordination normal.  Skin: Skin is warm and dry. No rash noted. Not diaphoretic. No erythema. No pallor.  Psychiatric: Mood, memory and judgment normal.  Vitals reviewed.  LABORATORY DATA: Lab Results  Component Value Date    WBC 6.8 09/09/2023   HGB 9.2 (L) 09/09/2023   HCT 29.9 (L) 09/09/2023   MCV 70.5 (L) 09/09/2023   PLT 322 09/09/2023      Chemistry      Component Value Date/Time   NA 138 09/09/2023 1147   NA 139 05/15/2012 0853   K 3.2 (L) 09/09/2023 1147   K 3.3 (L) 05/15/2012 0853   CL 108 09/09/2023 1147   CL 104 05/15/2012 0853   CO2 24 09/09/2023 1147   CO2 24 05/15/2012 0853   BUN 8 09/09/2023 1147   BUN 4 (L) 05/15/2012 0853   CREATININE 0.55 09/09/2023 1147   CREATININE 0.73 05/15/2012 0853      Component Value Date/Time   CALCIUM 8.9 09/09/2023 1147   CALCIUM 9.0 05/15/2012 0853   ALKPHOS 76 09/09/2023 1147   AST 12 (L) 09/09/2023 1147   ALT 12 09/09/2023 1147   BILITOT 0.3 09/09/2023 1147       RADIOGRAPHIC STUDIES:  No results found.   ASSESSMENT/PLAN:  This is a very pleasant 38 year old African-American female with iron  deficiency anemia secondary to menorrhagia secondary to uterine fibroids.  She has intolerance to oral iron  supplements.  She has received IV iron  in the past with Venofer  300 mg weekly x 3.  Her most recent dose has been on 09/29/23  Patient had a repeat CBC, CMP, iron  studies, and ferritin drawn today.  Labs today show CBC is within normal limits with the exception of her RDW being elevated but it is improved compared to prior.  Her other lab studies are pending at this time.  Looks like her SPEP from October showed M spike of 0.6.  I have added myeloma labs to her next lab draw.  I will send her a MyChart message with the results of her iron  studies and let her know she needs any additional IV iron  infusions.  The patient's blood pressure was elevated today.  She was asymptomatic.  We called the pharmacy and it turns out that she has several refills of losartan  at the pharmacy.  We called them and asked them to fill her losartan .  She was instructed to go straight there upon leaving the clinic today, pick up her blood pressure medication, take it,  and pick up a blood pressure cuff at [facility] to ensure improvement in her blood pressure.  We will also call her PCPs office for a follow-up visit.  She will be instructed should she ever develop any strokelike symptoms or does not have improvement with her blood pressure with taking losartan  with significant hypertension, to seek emergency evaluation.  We will see her back for follow-up visit in 2.5-3 months for evaluation and repeat blood work.   She will continue to follow with GYN regarding her heavy menstrual cycles.  She knows if she ever has signs and symptoms of worsening anemia such as fatigue and craving ice chips, that she is always free to call us  sooner for repeat blood work.  The patient was advised to call immediately if she has any concerning symptoms in the interval. The patient voices understanding of current disease status and treatment options and is in agreement with the current care plan. All questions were answered. The patient knows to call the clinic with any problems, questions or concerns. We can certainly see the patient much sooner if necessary      No orders of the defined types were placed in this encounter.     The total time spent in the appointment was 20-29 minutes  Brittany Caya L Mohsen Odenthal, PA-C 12/15/23

## 2023-12-17 ENCOUNTER — Other Ambulatory Visit: Payer: Self-pay | Admitting: Physician Assistant

## 2023-12-17 DIAGNOSIS — D500 Iron deficiency anemia secondary to blood loss (chronic): Secondary | ICD-10-CM

## 2023-12-18 ENCOUNTER — Other Ambulatory Visit (HOSPITAL_COMMUNITY): Payer: Self-pay

## 2023-12-18 ENCOUNTER — Telehealth: Payer: Self-pay

## 2023-12-18 ENCOUNTER — Inpatient Hospital Stay: Payer: Medicaid Other | Attending: Internal Medicine

## 2023-12-18 ENCOUNTER — Inpatient Hospital Stay (HOSPITAL_BASED_OUTPATIENT_CLINIC_OR_DEPARTMENT_OTHER): Payer: Medicaid Other | Admitting: Physician Assistant

## 2023-12-18 VITALS — BP 178/110 | HR 90 | Temp 98.0°F | Resp 16 | Wt 231.3 lb

## 2023-12-18 DIAGNOSIS — R778 Other specified abnormalities of plasma proteins: Secondary | ICD-10-CM | POA: Insufficient documentation

## 2023-12-18 DIAGNOSIS — N920 Excessive and frequent menstruation with regular cycle: Secondary | ICD-10-CM | POA: Insufficient documentation

## 2023-12-18 DIAGNOSIS — D500 Iron deficiency anemia secondary to blood loss (chronic): Secondary | ICD-10-CM

## 2023-12-18 DIAGNOSIS — D259 Leiomyoma of uterus, unspecified: Secondary | ICD-10-CM | POA: Diagnosis not present

## 2023-12-18 DIAGNOSIS — I10 Essential (primary) hypertension: Secondary | ICD-10-CM | POA: Insufficient documentation

## 2023-12-18 LAB — CBC WITH DIFFERENTIAL (CANCER CENTER ONLY)
Abs Immature Granulocytes: 0.01 10*3/uL (ref 0.00–0.07)
Basophils Absolute: 0.1 10*3/uL (ref 0.0–0.1)
Basophils Relative: 1 %
Eosinophils Absolute: 0.2 10*3/uL (ref 0.0–0.5)
Eosinophils Relative: 4 %
HCT: 40.8 % (ref 36.0–46.0)
Hemoglobin: 13.5 g/dL (ref 12.0–15.0)
Immature Granulocytes: 0 %
Lymphocytes Relative: 40 %
Lymphs Abs: 2.6 10*3/uL (ref 0.7–4.0)
MCH: 27.4 pg (ref 26.0–34.0)
MCHC: 33.1 g/dL (ref 30.0–36.0)
MCV: 82.8 fL (ref 80.0–100.0)
Monocytes Absolute: 0.4 10*3/uL (ref 0.1–1.0)
Monocytes Relative: 6 %
Neutro Abs: 3.2 10*3/uL (ref 1.7–7.7)
Neutrophils Relative %: 49 %
Platelet Count: 172 10*3/uL (ref 150–400)
RBC: 4.93 MIL/uL (ref 3.87–5.11)
RDW: 16.8 % — ABNORMAL HIGH (ref 11.5–15.5)
WBC Count: 6.5 10*3/uL (ref 4.0–10.5)
nRBC: 0 % (ref 0.0–0.2)

## 2023-12-18 LAB — IRON AND IRON BINDING CAPACITY (CC-WL,HP ONLY)
Iron: 64 ug/dL (ref 28–170)
Saturation Ratios: 15 % (ref 10.4–31.8)
TIBC: 414 ug/dL (ref 250–450)
UIBC: 350 ug/dL (ref 148–442)

## 2023-12-18 LAB — FERRITIN: Ferritin: 11 ng/mL (ref 11–307)

## 2023-12-18 LAB — SAMPLE TO BLOOD BANK

## 2023-12-18 MED FILL — Verapamil HCl Tab ER 120 MG: ORAL | 90 days supply | Qty: 180 | Fill #0 | Status: AC

## 2023-12-18 NOTE — Telephone Encounter (Signed)
 T/C to pt's PCP and spoke with Chiquita a nurse for Dr Janna.  Advised her of pt's elevated BP today and requested a f/up for evaluation.  She is going to contact the pt to set up an appt.  Their office is closing early today due to weather but she will get her in next week.  Pt was advised to monitor for headaches, blurred vision, shortness of breath, dizziness, chest pain, N/V and is to go to the ED or urgent care with any of these symptoms.  Pt has a refill for her losartan  waiting for her at [facility] and will pick it up on her way home.

## 2023-12-18 NOTE — Telephone Encounter (Signed)
 Received call from [facility] regarding patient. She reports that patient had two elevated BP readings at their office today.   Most recent reading of 178/110.   They called the patient's pharmacy to get her losartan  refilled.   Called patient and scheduled for follow up in our office with Dr. Tharon on Monday at 1:30 pm.   Advised of ED precautions.   Chiquita JAYSON English, RN

## 2023-12-21 ENCOUNTER — Other Ambulatory Visit: Payer: Self-pay

## 2023-12-21 ENCOUNTER — Encounter: Payer: Self-pay | Admitting: Family Medicine

## 2023-12-21 ENCOUNTER — Ambulatory Visit (INDEPENDENT_AMBULATORY_CARE_PROVIDER_SITE_OTHER): Payer: Medicaid Other | Admitting: Family Medicine

## 2023-12-21 ENCOUNTER — Other Ambulatory Visit (HOSPITAL_COMMUNITY): Payer: Self-pay

## 2023-12-21 ENCOUNTER — Emergency Department (HOSPITAL_COMMUNITY)
Admission: EM | Admit: 2023-12-21 | Discharge: 2023-12-22 | Disposition: A | Discharge status: Home / Self Care | Payer: Medicaid Other | Attending: Emergency Medicine | Admitting: Emergency Medicine

## 2023-12-21 ENCOUNTER — Encounter (HOSPITAL_COMMUNITY): Payer: Self-pay | Admitting: Emergency Medicine

## 2023-12-21 ENCOUNTER — Emergency Department (HOSPITAL_COMMUNITY): Payer: Medicaid Other

## 2023-12-21 VITALS — BP 157/100 | HR 96 | Ht 64.0 in | Wt 234.0 lb

## 2023-12-21 DIAGNOSIS — R519 Headache, unspecified: Secondary | ICD-10-CM | POA: Diagnosis present

## 2023-12-21 DIAGNOSIS — Z6841 Body Mass Index (BMI) 40.0 and over, adult: Secondary | ICD-10-CM

## 2023-12-21 DIAGNOSIS — I10 Essential (primary) hypertension: Secondary | ICD-10-CM

## 2023-12-21 DIAGNOSIS — G43409 Hemiplegic migraine, not intractable, without status migrainosus: Secondary | ICD-10-CM | POA: Insufficient documentation

## 2023-12-21 LAB — URINALYSIS, ROUTINE W REFLEX MICROSCOPIC
Bilirubin Urine: NEGATIVE
Glucose, UA: NEGATIVE mg/dL
Ketones, ur: NEGATIVE mg/dL
Nitrite: NEGATIVE
Protein, ur: NEGATIVE mg/dL
Specific Gravity, Urine: 1.016 (ref 1.005–1.030)
pH: 7 (ref 5.0–8.0)

## 2023-12-21 LAB — RAPID URINE DRUG SCREEN, HOSP PERFORMED
Amphetamines: NOT DETECTED
Barbiturates: NOT DETECTED
Benzodiazepines: NOT DETECTED
Cocaine: NOT DETECTED
Opiates: NOT DETECTED
Tetrahydrocannabinol: NOT DETECTED

## 2023-12-21 LAB — COMPREHENSIVE METABOLIC PANEL
ALT: 19 U/L (ref 0–44)
AST: 21 U/L (ref 15–41)
Albumin: 3.5 g/dL (ref 3.5–5.0)
Alkaline Phosphatase: 68 U/L (ref 38–126)
Anion gap: 10 (ref 5–15)
BUN: 5 mg/dL — ABNORMAL LOW (ref 6–20)
CO2: 22 mmol/L (ref 22–32)
Calcium: 8.9 mg/dL (ref 8.9–10.3)
Chloride: 108 mmol/L (ref 98–111)
Creatinine, Ser: 0.52 mg/dL (ref 0.44–1.00)
GFR, Estimated: >60 mL/min (ref >60)
Glucose, Bld: 114 mg/dL — ABNORMAL HIGH (ref 70–99)
Potassium: 3.2 mmol/L — ABNORMAL LOW (ref 3.5–5.1)
Sodium: 140 mmol/L (ref 135–145)
Total Bilirubin: 0.6 mg/dL (ref 0.0–1.2)
Total Protein: 6.7 g/dL (ref 6.5–8.1)

## 2023-12-21 LAB — APTT: aPTT: 25 s (ref 24–36)

## 2023-12-21 LAB — CBC
HCT: 39.3 % (ref 36.0–46.0)
Hemoglobin: 12.8 g/dL (ref 12.0–15.0)
MCH: 27.2 pg (ref 26.0–34.0)
MCHC: 32.6 g/dL (ref 30.0–36.0)
MCV: 83.6 fL (ref 80.0–100.0)
Platelets: 139 10*3/uL — ABNORMAL LOW (ref 150–400)
RBC: 4.7 MIL/uL (ref 3.87–5.11)
RDW: 16.8 % — ABNORMAL HIGH (ref 11.5–15.5)
WBC: 6.3 10*3/uL (ref 4.0–10.5)
nRBC: 0 % (ref 0.0–0.2)

## 2023-12-21 LAB — PROTIME-INR
INR: 1.1 (ref 0.8–1.2)
Prothrombin Time: 14.7 s (ref 11.4–15.2)

## 2023-12-21 LAB — HCG, SERUM, QUALITATIVE: Preg, Serum: NEGATIVE

## 2023-12-21 MED ORDER — ACETAMINOPHEN 325 MG PO TABS
650.0000 mg | ORAL_TABLET | Freq: Once | ORAL | Status: AC
  Administered 2023-12-21: 650 mg via ORAL
  Filled 2023-12-21: qty 2

## 2023-12-21 MED ORDER — LOSARTAN POTASSIUM-HCTZ 50-12.5 MG PO TABS
1.0000 | ORAL_TABLET | Freq: Every day | ORAL | 0 refills | Status: DC
  Filled 2023-12-21: qty 30, 30d supply, fill #0

## 2023-12-21 NOTE — Patient Instructions (Addendum)
 I have sent in a medication called Hyzaar  which is a combination of hydrochlorothiazide  and losartan .  Stop taking the losartan  alone. I have sent in a referral for the nutritionist to help speak to you about your eating habits.  This could help you to lose more weight.  Losing weight can also help with your blood pressure. Come back for blood pressure recheck in 1 week.

## 2023-12-21 NOTE — ED Triage Notes (Addendum)
 Pt reports she was sent by her PCP for hypertension. Pt also reports headache. Per PCP note concern for diffuse right side weakness and unsteady gait. Pt unsure of when these symptoms started.

## 2023-12-21 NOTE — Progress Notes (Signed)
    SUBJECTIVE:   CHIEF COMPLAINT / HPI:   BP follow up Was at [facility] on 1/10 when found elevated BP to 178/110.  She was asymptomatic at this time.  Patient had been out of her losartan , so this was refilled. Has been having headaches on and off.  Has a headache now. Has a little bit of chest pain at the same time as headaches. No chest pain now. Has some SOB which she attributes to having more weight. Also feels she is more weak on the right side and has been walking differently over the last 2 weeks.  Weight gain, eating problems Has days where she does not eat. Other days she will eat a lot and vomit, though she does not make herself vomit. She then goes back down to not eating. This cycle continues. Feels she cooks a lot on her days off and does not eat much days she works. No abdominal pain when not eating too much.  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 in October 24  OBJECTIVE:   BP (!) 157/100   Pulse 96   Ht 5' 4 (1.626 m)   Wt 234 lb (106.1 kg)   SpO2 100%   BMI 40.17 kg/m   General: Alert and oriented, in NAD Skin: Warm, dry, and intact HEENT: NCAT, EOM grossly normal, midline nasal septum Cardiac: RRR, no m/r/g appreciated Respiratory: CTAB, breathing and speaking comfortably on RA Abdominal: Soft, mildly tender in the mid abdomen and right side of abdomen without rebound or guarding, nondistended, active bowel sounds Extremities: Moves all extremities grossly equally Neurological: Strength 3/5 diffusely on the right side when compared to the left, gait overall unremarkable though abnormal heel-to-toe testing, patellar reflexes intact bilaterally, sensation intact throughout Psychiatric: Appropriate mood and affect   ASSESSMENT/PLAN:   HTN (hypertension) BP elevated today with neurologic symptoms including right sided weakness and abnormal heel-to-toe testing as well  as reported chest pain, headaches (though has been worked up in the past), shortness of breath.  Symptoms concerning for end-organ damage.  Of note, patient was thought to have had a stroke with similar symptoms in the past, though workup with neurology demonstrated likely symptoms due to headaches.  Because of her history and symptoms, discussed ED evaluation for likely CT head plus or minus MRI, EKG, and troponins.  Will need follow-up with PCP after ED visit for further titration of BP medications.  Consider switching to Hyzaar .  Also consider sleep apnea as contributing to elevated BPs.  BMI 40.0-44.9, adult (HCC) Also endorsed weight gain and unsustainable eating patterns today with likely episodes of binging.  Has never seen a nutritionist.  Will send referral to nutritionist.  Depo-Provera  could also be contributing.  Follow-up at next visit with PCP after acute evaluation for hypertension.   Stuart Redo, MD [facility]

## 2023-12-21 NOTE — ED Provider Triage Note (Signed)
 Emergency Medicine Provider Triage Evaluation Note  Brittany Graves , a 38 y.o. female  was evaluated in triage.  Pt complains of hypertension, headache, right arm weakness. States that same began 1 week ago. Ran out of bp meds 2 months ago. Sent from pcp with concern for stroke. Has hx of stroke and complex migraine.   Review of Systems  Positive:  Negative:   Physical Exam  BP (!) 158/105 (BP Location: Left Arm)   Pulse 83   Temp 98.1 F (36.7 C)   Resp 16   SpO2 100%  Gen:   Awake, no distress   Resp:  Normal effort  MSK:   Moves extremities without difficulty  Other:  Ambulatory with steady gait, 4/5 strength in right arm, 5/5 in left arm. No other neurologic deficits  Medical Decision Making  Medically screening exam initiated at 5:06 PM.  Appropriate orders placed.  Brittany Graves was informed that the remainder of the evaluation will be completed by another provider, this initial triage assessment does not replace that evaluation, and the importance of remaining in the ED until their evaluation is complete.  Work-up initiated. Symptom onset 1 week ago   [name], PA-C 12/21/23 1709

## 2023-12-22 DIAGNOSIS — Z6841 Body Mass Index (BMI) 40.0 and over, adult: Secondary | ICD-10-CM | POA: Insufficient documentation

## 2023-12-22 MED ORDER — PROCHLORPERAZINE EDISYLATE 10 MG/2ML IJ SOLN
10.0000 mg | Freq: Once | INTRAMUSCULAR | Status: AC
  Administered 2023-12-22: 10 mg via INTRAVENOUS
  Filled 2023-12-22: qty 2

## 2023-12-22 MED ORDER — KETOROLAC TROMETHAMINE 15 MG/ML IJ SOLN
15.0000 mg | Freq: Once | INTRAMUSCULAR | Status: AC
  Administered 2023-12-22: 15 mg via INTRAVENOUS
  Filled 2023-12-22: qty 1

## 2023-12-22 MED ORDER — DIPHENHYDRAMINE HCL 50 MG/ML IJ SOLN
12.5000 mg | Freq: Once | INTRAMUSCULAR | Status: AC
  Administered 2023-12-22: 12.5 mg via INTRAVENOUS
  Filled 2023-12-22: qty 1

## 2023-12-22 NOTE — ED Provider Notes (Signed)
 [place] EMERGENCY DEPARTMENT AT [facility] Provider Note  CSN: [id number] Arrival date & time: 12/21/23 1453  Chief Complaint(s) Hypertension  HPI Brittany Graves is a 38 y.o. female history hypertension, hemiplegic migraine presenting with weakness.  Patient reports weakness on the right arm for around a week.  She has also been having on and off headaches.  No head trauma, fevers or chills, vision changes, neck pain or stiffness, numbness or tingling, trouble walking, chest pain, back pain, abdominal pain.  She reports that she has been out of her medications, she lost her bag to have the medicines in them.  She reports that when she was taking her verapamil  medication it was helping with her headaches and she was not having any.  She was seen by her family medicine doctor today who sent her in for further evaluation.  Past Medical History Past Medical History:  Diagnosis Date   Hypertension    Patient Active Problem List   Diagnosis Date Noted   BMI 40.0-44.9, adult (HCC) 12/22/2023   Abnormal SPEP 12/18/2023   Complex cyst of left ovary 10/02/2023   Breakthrough bleeding on depo provera  10/02/2023   ASCUS with positive high risk HPV cervical 08/20/2023   Chronic migraine w/o aura w/o status migrainosus, not intractable 08/18/2023   Intramural leiomyoma of uterus 08/13/2023   Dysmenorrhea 08/12/2023   Menorrhagia 07/09/2023   Work stress 07/09/2023   Iron  deficiency anemia 06/19/2023   Encounter for smoking cessation counseling 06/19/2023   HTN (hypertension) 06/18/2023   Headache 06/18/2023   Stroke (HCC) 06/18/2023   Weakness 06/18/2023   Home Medication(s) Prior to Admission medications   Medication Sig Start Date End Date Taking? Authorizing Provider  HYDROcodone -acetaminophen  (NORCO/VICODIN) 5-325 MG tablet Take 1 tablet by mouth every 6 (six) hours as needed for moderate pain (pain score 4-6) or severe pain (pain score 7-10). 10/24/23  Yes Hagler, Redell,  MD  losartan -hydrochlorothiazide  (HYZAAR ) 50-12.5 MG tablet Take 1 tablet by mouth daily. 12/21/23  Yes Mabe, Elna, MD  nicotine  (NICODERM CQ ) 21 mg/24hr patch Place 1 patch (21 mg total) onto the skin daily. 07/09/23  Yes Gomes, Adriana, DO  polyethylene glycol powder (GLYCOLAX /MIRALAX ) 17 GM/SCOOP powder Take 17 g by mouth daily. 06/19/23  Yes Cleotilde Perkins, DO  verapamil  (CALAN -SR) 120 MG CR tablet Take 1 tablet (120 mg total) by mouth 2 (two) times daily. 08/20/23  Yes Janna Ferrier, DO                                                                                                                                    Past Surgical History Past Surgical History:  Procedure Laterality Date   COLPOSCOPY  10/02/2023   TUBAL LIGATION     Family History Family History  Problem Relation Age of Onset   Hypertension Mother    Diabetes Mother    Heart disease Mother    Thyroid  disease Mother  Hypertension Father    Diabetes Father    Hypertension Sister    Thyroid  disease Sister     Social History Social History   Tobacco Use   Smoking status: Some Days    Types: Cigarettes   Smokeless tobacco: Never  Vaping Use   Vaping status: Never Used  Substance Use Topics   Alcohol use: Yes    Comment: socially   Drug use: Not Currently   Allergies Patient has no known allergies.  Review of Systems Review of Systems  All other systems reviewed and are negative.   Physical Exam Vital Signs  I have reviewed the triage vital signs BP (!) 189/110   Pulse 90   Temp 98.6 F (37 C) (Oral)   Resp 17   SpO2 98%  Physical Exam Vitals and nursing note reviewed.  Constitutional:      General: She is not in acute distress.    Appearance: She is well-developed.  HENT:     Head: Normocephalic and atraumatic.     Mouth/Throat:     Mouth: Mucous membranes are moist.  Eyes:     Pupils: Pupils are equal, round, and reactive to light.  Cardiovascular:     Rate and Rhythm: Normal rate and  regular rhythm.     Heart sounds: No murmur heard. Pulmonary:     Effort: Pulmonary effort is normal. No respiratory distress.     Breath sounds: Normal breath sounds.  Abdominal:     General: Abdomen is flat.     Palpations: Abdomen is soft.     Tenderness: There is no abdominal tenderness.  Musculoskeletal:        General: No tenderness.     Right lower leg: No edema.     Left lower leg: No edema.  Skin:    General: Skin is warm and dry.  Neurological:     Mental Status: She is alert.     Comments: Cranial nerves II through XII intact, strength 4 out of 5 in the right upper extremity with grip strength, flexion/extension at the elbow, with mild pronator drift.  Otherwise strength 5 out of 5 throughout.  No sensory deficit to light touch throughout.  No dysmetria.  Normal gait.  Psychiatric:        Mood and Affect: Mood normal.        Behavior: Behavior normal.     ED Results and Treatments Labs (all labs ordered are listed, but only abnormal results are displayed) Labs Reviewed  CBC - Abnormal; Notable for the following components:      Result Value   RDW 16.8 (*)    Platelets 139 (*)    All other components within normal limits  COMPREHENSIVE METABOLIC PANEL - Abnormal; Notable for the following components:   Potassium 3.2 (*)    Glucose, Bld 114 (*)    BUN 5 (*)    All other components within normal limits  URINALYSIS, ROUTINE W REFLEX MICROSCOPIC - Abnormal; Notable for the following components:   APPearance CLOUDY (*)    Hgb urine dipstick MODERATE (*)    Leukocytes,Ua MODERATE (*)    Bacteria, UA RARE (*)    All other components within normal limits  PROTIME-INR  APTT  RAPID URINE DRUG SCREEN, HOSP PERFORMED  HCG, SERUM, QUALITATIVE  Radiology CT HEAD WO CONTRAST Result Date: 12/21/2023 CLINICAL DATA:  Hypertension, headache, right arm  weakness EXAM: CT HEAD WITHOUT CONTRAST TECHNIQUE: Contiguous axial images were obtained from the base of the skull through the vertex without intravenous contrast. RADIATION DOSE REDUCTION: This exam was performed according to the departmental dose-optimization program which includes automated exposure control, adjustment of the mA and/or kV according to patient size and/or use of iterative reconstruction technique. COMPARISON:  06/18/2023 CT head FINDINGS: Brain: No evidence of acute infarction, hemorrhage, mass, mass effect, or midline shift. No hydrocephalus or extra-axial fluid collection. Partial empty sella. Normal craniocervical junction. Vascular: No hyperdense vessel. Skull: Negative for fracture or suspicious osseous lesion. Redemonstrated right frontal calvarial osteoma. Sinuses/Orbits: Mucosal thickening in the ethmoid air cells and frontal sinuses. Somewhat tortuous optic nerves. No other acute finding in the orbits. Other: The mastoid air cells are well aerated. IMPRESSION: 1. No acute intracranial process. 2. 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 Electronically Signed   By: Donald Campion M.D.   On: 12/21/2023 19:30    Pertinent labs & imaging results that were available during my care of the patient were reviewed by me and considered in my medical decision making (see MDM for details).  Medications Ordered in ED Medications  acetaminophen  (TYLENOL ) tablet 650 mg (650 mg Oral Given 12/21/23 1722)  ketorolac  (TORADOL ) 15 MG/ML injection 15 mg (15 mg Intravenous Given 12/22/23 1049)  prochlorperazine  (COMPAZINE ) injection 10 mg (10 mg Intravenous Given 12/22/23 1045)  diphenhydrAMINE  (BENADRYL ) injection 12.5 mg (12.5 mg Intravenous Given 12/22/23 1042)                                                                                                                                      Procedures Procedures  (including critical care time)  Medical Decision Making / ED Course   MDM:  38 year old female presenting to the emergency department with weakness.  Patient overall well-appearing, physical examination with slight right arm weakness.  She does report mild headache which has improved with Tylenol .  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  She has had symptoms for 1 week and her CT scan is negative for any sign of stroke, bleeding, intracranial mass.  There are some incidental findings that are associated with IIH but her symptoms seem more consistent with her prior migraine headache.  Recommended follow-up with her outpatient neurologist regarding these findings.  Urinalysis has some leukocytes and bacteria but is contaminated, patient is not having any urinary symptoms.  Will give migraine cocktail, reassess.  If symptoms are improved after this anticipate discharge with refills of her medications.  Clinical Course as of 12/22/23 1245  Tue Dec 22, 2023  1244 On repeat assessment, weakness has resolved.  She reports that her headache resolved after migraine cocktail.  She  request discharge. Will discharge patient to home. All questions answered. Patient comfortable with plan of discharge. Return precautions discussed with patient and specified on the after visit summary.  [WS]    Clinical Course User Index [WS] Francesca Elsie CROME, MD     Additional history obtained: -External records from outside source obtained and reviewed including: Chart review including previous notes, labs, imaging, consultation notes including prior admission for similar presentation   Lab Tests: -I ordered, reviewed, and interpreted labs.   The pertinent results include:   Labs Reviewed  CBC - Abnormal; Notable for the following components:      Result Value   RDW 16.8 (*)    Platelets 139 (*)    All other components within normal limits   COMPREHENSIVE METABOLIC PANEL - Abnormal; Notable for the following components:   Potassium 3.2 (*)    Glucose, Bld 114 (*)    BUN 5 (*)    All other components within normal limits  URINALYSIS, ROUTINE W REFLEX MICROSCOPIC - Abnormal; Notable for the following components:   APPearance CLOUDY (*)    Hgb urine dipstick MODERATE (*)    Leukocytes,Ua MODERATE (*)    Bacteria, UA RARE (*)    All other components within normal limits  PROTIME-INR  APTT  RAPID URINE DRUG SCREEN, HOSP PERFORMED  HCG, SERUM, QUALITATIVE    Notable for mild hypokalemia   EKG   EKG Interpretation Date/Time:  Monday December 21 2023 17:27:46 EST Ventricular Rate:  80 PR Interval:  150 QRS Duration:  90 QT Interval:  392 QTC Calculation: 452 R Axis:   46  Text Interpretation: Normal sinus rhythm Normal ECG When compared with ECG of 18-Jun-2023 16:05, PREVIOUS ECG IS PRESENT No acute changes No significant change since last tracing Confirmed by Charlyn Sora [phone]) on 12/22/2023 9:51:19 AM         Imaging Studies ordered: I ordered imaging studies including CT head On my interpretation imaging demonstrates no acute process I independently visualized and interpreted imaging. I agree with the radiologist interpretation   Medicines ordered and prescription drug management: Meds ordered this encounter  Medications   acetaminophen  (TYLENOL ) tablet 650 mg   ketorolac  (TORADOL ) 15 MG/ML injection 15 mg   prochlorperazine  (COMPAZINE ) injection 10 mg   diphenhydrAMINE  (BENADRYL ) injection 12.5 mg    -I have reviewed the patients home medicines and have made adjustments as needed  Cardiac Monitoring: The patient was maintained on a cardiac monitor.  I personally viewed and interpreted the cardiac monitored which showed an underlying rhythm of: NSR  Social Determinants of Health:  Diagnosis or treatment significantly limited by social determinants of health: obesity   Reevaluation: After the  interventions noted above, I reevaluated the patient and found that their symptoms have resolved  Co morbidities that complicate the patient evaluation  Past Medical History:  Diagnosis Date   Hypertension       Dispostion: Disposition decision including need for hospitalization was considered, and patient discharged from emergency department.    Final Clinical Impression(s) / ED Diagnoses Final diagnoses:  Hemiplegic migraine without status migrainosus, not intractable     This chart was dictated using voice recognition software.  Despite best efforts to proofread,  errors can occur which can change the documentation meaning.    Francesca Elsie CROME, MD 12/22/23 1245

## 2023-12-22 NOTE — Assessment & Plan Note (Signed)
 Also endorsed weight gain and unsustainable eating patterns today with likely episodes of binging.  Has never seen a nutritionist.  Will send referral to nutritionist.  Depo-Provera  could also be contributing.  Follow-up at next visit with PCP after acute evaluation for hypertension.

## 2023-12-22 NOTE — Assessment & Plan Note (Addendum)
 BP elevated today with neurologic symptoms including right sided weakness and abnormal heel-to-toe testing as well as reported chest pain, headaches (though has been worked up in the past), shortness of breath.  Symptoms concerning for end-organ damage.  Of note, patient was thought to have had a stroke with similar symptoms in the past, though workup with neurology demonstrated likely symptoms due to headaches.  Because of her history and symptoms, discussed ED evaluation for likely CT head plus or minus MRI, EKG, and troponins.  Will need follow-up with PCP after ED visit for further titration of BP medications.  Consider switching to Hyzaar .  Also consider sleep apnea as contributing to elevated BPs.

## 2023-12-22 NOTE — Discharge Instructions (Addendum)
 We evaluated you for your headache.  Your testing was reassuring.  Please follow-up with your primary doctor and your neurologist.  Please take your medications as prescribed.  Please return if you have any new or worsening symptoms.

## 2023-12-23 ENCOUNTER — Other Ambulatory Visit (HOSPITAL_COMMUNITY): Payer: Self-pay

## 2023-12-25 ENCOUNTER — Encounter: Payer: Self-pay | Admitting: Physician Assistant

## 2023-12-31 NOTE — Progress Notes (Deleted)
    SUBJECTIVE:   CHIEF COMPLAINT / HPI: BP follow up  HTN Patient was recently seen at [facility] on 1/10 when found elevated BP to 178/110 (asymptomatic).  She was again seen by Dr. Phineas Real on 1/13 with elevated BP to 157/100 (symptomatic).  She had been complaining of headaches and slight chest pain occasionally with some SOB with some R sided weakness.  Patient was advised to go to the ED for further workup where she got an CT head w/o contrast which showed no acute intracranial process, but a partial empty sella and tortuous optic nerves, which are nonspecific but can be seen in the setting of idiopathic intracranial HTN.  EDP suspected hemiplegic migraine as a primary cause of her symptoms since they seem more consistent with her prior migraine headache vs IIH.  Recommended follow-up with her outpatient neurologist.   PERTINENT  PMH / PSH:  HTN, tobacco use, hidradenitis suppurativa   OBJECTIVE:   There were no vitals taken for this visit.  General: Awake and Alert in NAD HEENT: NCAT. Sclera anicteric. No rhinorrhea. No oropharyngeal erythema. Cardiovascular: RRR. No M/R/G Respiratory: CTAB, normal WOB on RA. No wheezing, crackles, rhonchi, or diminished breath sounds. Abdomen: Soft, non-tender, non-distended. Bowel sounds normoactive/hypoactive/hyperactive. *** Extremities: Able to move all extremities equally. No BLE edema, no deformities or significant joint findings. Skin: Warm and dry. No abrasions or rashes noted. Neuro: A&Ox***. No focal neurological deficits.  ASSESSMENT/PLAN:   No problem-specific Assessment & Plan notes found for this encounter.     [name], DO [facility]

## 2024-01-01 ENCOUNTER — Ambulatory Visit: Payer: Medicaid Other | Admitting: Family Medicine

## 2024-01-17 ENCOUNTER — Ambulatory Visit (HOSPITAL_COMMUNITY): Payer: Medicaid Other

## 2024-01-18 ENCOUNTER — Ambulatory Visit (HOSPITAL_COMMUNITY): Payer: Medicaid Other

## 2024-01-27 ENCOUNTER — Other Ambulatory Visit (HOSPITAL_COMMUNITY)
Admission: RE | Admit: 2024-01-27 | Discharge: 2024-01-27 | Disposition: A | Discharge status: Home / Self Care | Payer: Medicaid Other | Source: Ambulatory Visit | Attending: Obstetrics and Gynecology | Admitting: Obstetrics and Gynecology

## 2024-01-27 ENCOUNTER — Encounter: Payer: Self-pay | Admitting: Obstetrics and Gynecology

## 2024-01-27 ENCOUNTER — Ambulatory Visit: Payer: Medicaid Other | Admitting: Obstetrics and Gynecology

## 2024-01-27 ENCOUNTER — Other Ambulatory Visit: Payer: Self-pay

## 2024-01-27 VITALS — BP 150/101 | HR 84 | Wt 234.0 lb

## 2024-01-27 DIAGNOSIS — Z202 Contact with and (suspected) exposure to infections with a predominantly sexual mode of transmission: Secondary | ICD-10-CM | POA: Diagnosis not present

## 2024-01-27 DIAGNOSIS — N939 Abnormal uterine and vaginal bleeding, unspecified: Secondary | ICD-10-CM | POA: Diagnosis present

## 2024-01-27 DIAGNOSIS — N898 Other specified noninflammatory disorders of vagina: Secondary | ICD-10-CM

## 2024-01-27 DIAGNOSIS — D251 Intramural leiomyoma of uterus: Secondary | ICD-10-CM

## 2024-01-27 DIAGNOSIS — Z30013 Encounter for initial prescription of injectable contraceptive: Secondary | ICD-10-CM | POA: Diagnosis not present

## 2024-01-27 DIAGNOSIS — Z3202 Encounter for pregnancy test, result negative: Secondary | ICD-10-CM

## 2024-01-27 HISTORY — PX: ENDOMETRIAL BIOPSY: PRO73

## 2024-01-27 LAB — POCT PREGNANCY, URINE: Preg Test, Ur: NEGATIVE

## 2024-01-27 MED ORDER — MEDROXYPROGESTERONE ACETATE 150 MG/ML IM SUSY
150.0000 mg | PREFILLED_SYRINGE | Freq: Once | INTRAMUSCULAR | Status: AC
Start: 2024-01-27 — End: 2024-01-27
  Administered 2024-01-27: 150 mg via INTRAMUSCULAR

## 2024-01-27 NOTE — Procedures (Signed)
Endometrial Biopsy Procedure Note  Pre-operative Diagnosis: AUB  Post-operative Diagnosis: same  Procedure Details  Cervical exam performed in the presence of a chaperone Urine pregnancy test was done and result was negative.  The risks (including infection, bleeding, pain, and uterine perforation) and benefits of the procedure were explained to the patient and Written informed consent was obtained.  The patient was placed in the dorsal lithotomy position.  Bimanual exam showed the uterus to be in the neutral position.  A Graves' speculum inserted in the vagina, and the cervix was visualized and a vaginal swab performed. The cervix was then prepped with povidone iodine, and a sharp tenaculum was applied to the anterior lip of the cervix for stabilization.  A pipelle was inserted into the uterine cavity and sounded the uterus to a depth of 7cm.  A Small amount of tissue was collected after 2 passes. The sample was sent for pathologic examination.  Condition: Stable  Complications: None  Plan: The patient was advised to call for any fever or for prolonged or severe pain or bleeding. She was advised to use OTC analgesics as needed for mild to moderate pain. She was advised to avoid vaginal intercourse for 48 hours or until the bleeding has completely stopped.  Cornelia Copa MD Attending Center for [employer] [facility])

## 2024-01-27 NOTE — Progress Notes (Signed)
Obstetrics and Gynecology Visit Return Patient Evaluation  Appointment Date: 01/27/2024  Primary Care Provider: Fortunato Graves  OBGYN Clinic: [facility] for [facility]  Chief Complaint: f/u heavy and painful periods, fibroids  History of Present Illness:  Brittany Graves is a 38 y.o. 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  Patient elected to try depo provera in early September 2024 to help with her heavy and painful periods. Pre-depo, periods approx 1 wk and heavy and painful. She missed her depo #2 dose in December. She states that she had a 2wk long period with AUB from mid to late January, spotting throughout December and November and heavier AUB in October after the colpo.   Patient followed by Heme with IV iron and Last Hgb 12.8 on 1/13  Interval History: no current bleeding  Review of Systems:  as noted in the History of Present Illness.  Patient Active Problem List   Diagnosis Date Noted   BMI 40.0-44.9, adult (HCC) 12/22/2023   Abnormal SPEP 12/18/2023   Complex cyst of left ovary 10/02/2023   Breakthrough bleeding on depo provera 10/02/2023   ASCUS with positive high risk HPV cervical 08/20/2023   Chronic migraine w/o aura w/o status migrainosus, not intractable 08/18/2023   Intramural leiomyoma of uterus 08/13/2023   Dysmenorrhea 08/12/2023   Menorrhagia 07/09/2023   Work stress 07/09/2023   Iron deficiency anemia 06/19/2023   Encounter for smoking cessation counseling 06/19/2023   HTN (hypertension) 06/18/2023   Headache 06/18/2023   Stroke (HCC) 06/18/2023   Weakness 06/18/2023   Medications:  Brittany Graves had no medications administered during this visit. Current Outpatient Medications  Medication Sig Dispense Refill    losartan-hydrochlorothiazide (HYZAAR) 50-12.5 MG tablet Take 1 tablet by mouth daily. 30 tablet 0   HYDROcodone-acetaminophen (NORCO/VICODIN) 5-325 MG tablet Take 1 tablet by mouth every 6 (six) hours as needed for moderate pain (pain score 4-6) or severe pain (pain score 7-10). (Patient not taking: Reported on 01/27/2024) 8 tablet 0   nicotine (NICODERM CQ) 21 mg/24hr patch Place 1 patch (21 mg total) onto the skin daily. (Patient not taking: Reported on 01/27/2024) 28 patch 0   polyethylene glycol powder (GLYCOLAX/MIRALAX) 17 GM/SCOOP powder Take 17 g by mouth daily. (Patient not taking: Reported on 01/27/2024) 500 g 0   verapamil (CALAN-SR) 120 MG CR tablet Take 1 tablet (120 mg total) by mouth 2 (two) times daily. (Patient not taking: Reported on 01/27/2024) 180 tablet 3   No current facility-administered medications for this visit.    Allergies: has no known allergies.  Physical Exam:  BP (!) 152/101   Pulse 84   Wt 234 lb (106.1 kg)   LMP 01/08/2024   BMI 40.17 kg/m  Body mass index is 40.17 kg/m. General appearance: Well nourished, well developed female in no acute distress.  Abdomen: diffusely non tender to palpation, non distended, and no masses, hernias Neuro/Psych:  Normal mood and affect.    Pelvic exam:  Cervical exam performed in the presence of a chaperone EGBUS: wnl Vaginal vault: white discharge, +odor Cervix:  wnl Bimanual: negative  See procedure note for embx   Assessment: patient doing well  Plan:  1. Abnormal uterine bleeding (AUB) (Primary) Long d/w her re: options. I told her that, ideally, you want to give depo provera 2 shots/6  months before stopping for AUB. I told her that her, medically, her other options are limited. Mirena potential but would need u/s guidance due to fibroids and POPs, but that is daily. I also d/w her re: surgical options with [place] and [place]. Pt interested in hearing about those options. Referrals made to IR and message sent to [facility] to  get her set up with Dr. Hyacinth Graves for consultation.   In the interim, patient would like to restart depo. Shot given today - Ambulatory referral to Interventional Radiology  2. Intramural leiomyoma of uterus - Ambulatory referral to Interventional Radiology  Return in about 3 months (around 04/25/2024) for in person, with dr Brittany Graves.  Future Appointments  Date Time Provider Department Center  02/11/2024 11:15 AM Brittany Graves [facility] NDM  02/24/2024  9:15 AM [facility] None  02/24/2024  9:45 AM Brittany Graves [facility] None    Brittany Graves Attending [facility])

## 2024-01-28 ENCOUNTER — Telehealth (HOSPITAL_BASED_OUTPATIENT_CLINIC_OR_DEPARTMENT_OTHER): Payer: Self-pay | Admitting: Obstetrics and Gynecology

## 2024-01-28 ENCOUNTER — Encounter: Payer: Self-pay | Admitting: General Practice

## 2024-01-28 NOTE — Telephone Encounter (Signed)
 Called patient and left a message to call the office back to schedule the appointment x2

## 2024-01-29 ENCOUNTER — Other Ambulatory Visit (HOSPITAL_COMMUNITY): Payer: Self-pay

## 2024-01-29 ENCOUNTER — Encounter: Payer: Self-pay | Admitting: Obstetrics and Gynecology

## 2024-01-29 LAB — SURGICAL PATHOLOGY

## 2024-01-29 LAB — CERVICOVAGINAL ANCILLARY ONLY
Bacterial Vaginitis (gardnerella): POSITIVE — AB
Candida Glabrata: NEGATIVE
Candida Vaginitis: POSITIVE — AB
Chlamydia: NEGATIVE
Comment: NEGATIVE
Comment: NEGATIVE
Comment: NEGATIVE
Comment: NEGATIVE
Comment: NEGATIVE
Comment: NORMAL
Neisseria Gonorrhea: NEGATIVE
Trichomonas: NEGATIVE

## 2024-01-29 MED ORDER — METRONIDAZOLE 500 MG PO TABS
500.0000 mg | ORAL_TABLET | Freq: Two times a day (BID) | ORAL | 0 refills | Status: AC
  Filled 2024-01-29: qty 14, 7d supply, fill #0

## 2024-01-29 MED ORDER — FLUCONAZOLE 150 MG PO TABS
150.0000 mg | ORAL_TABLET | Freq: Once | ORAL | 0 refills | Status: AC
Start: 2024-01-29 — End: 2024-01-30
  Filled 2024-01-29: qty 1, 1d supply, fill #0

## 2024-01-29 NOTE — Addendum Note (Signed)
Addended by: Enfield Bing on: 01/29/2024 05:44 PM   Modules accepted: Orders

## 2024-02-03 ENCOUNTER — Other Ambulatory Visit: Payer: Self-pay | Admitting: Family Medicine

## 2024-02-04 ENCOUNTER — Other Ambulatory Visit (HOSPITAL_COMMUNITY): Payer: Self-pay

## 2024-02-04 MED ORDER — LOSARTAN POTASSIUM-HCTZ 50-12.5 MG PO TABS
1.0000 | ORAL_TABLET | Freq: Every day | ORAL | 0 refills | Status: DC
  Filled 2024-02-04: qty 30, 30d supply, fill #0

## 2024-02-04 NOTE — Telephone Encounter (Signed)
 Called patient and scheduled BP follow up for 02/11/24 with PCP.   Veronda Prude, RN

## 2024-02-05 NOTE — Progress Notes (Deleted)
    SUBJECTIVE:   CHIEF COMPLAINT / HPI: BP  ***  PERTINENT  PMH / PSH: AUB, Leiomyoma  OBJECTIVE:   LMP 01/08/2024   General: Awake and Alert in NAD HEENT: NCAT. Sclera anicteric. No rhinorrhea. No oropharyngeal erythema. Cardiovascular: RRR. No M/R/G Respiratory: CTAB, normal WOB on RA. No wheezing, crackles, rhonchi, or diminished breath sounds. Abdomen: Soft, non-tender, non-distended. Bowel sounds normoactive/hypoactive/hyperactive. *** Extremities: Able to move all extremities equally. No BLE edema, no deformities or significant joint findings. Skin: Warm and dry. No abrasions or rashes noted. Neuro: A&Ox***. No focal neurological deficits.  ASSESSMENT/PLAN:  .medication Assessment & Plan Hypertension, unspecified type Patient's blood pressure {is/is not:320031} controlled today. Goal of 130/80. Patient's medication regimen includes Losartan-hydrochlorothiazide 50-12.5 mg. - Changes to current regimen include *** - Referral to pharmacy clinic due to {KOVALBPCRITERIA:29903} - Labs: BMP - BP log added to patient's AVS*** - Follow up in *** weeks  [name], DO [facility]

## 2024-02-05 NOTE — Assessment & Plan Note (Deleted)
 Patient's blood pressure  controlled today. Goal of 130/80. Patient's medication regimen includes Losartan-hydrochlorothiazide 50-12.5 mg. - Changes to current regimen include *** - Referral to pharmacy clinic due to  - Labs: BMP - BP log added to patient's AVS*** - Follow up in *** weeks

## 2024-02-11 ENCOUNTER — Encounter: Payer: Medicaid Other | Attending: Family Medicine | Admitting: Dietician

## 2024-02-11 ENCOUNTER — Encounter: Payer: Self-pay | Admitting: Dietician

## 2024-02-11 ENCOUNTER — Ambulatory Visit: Payer: Medicaid Other | Admitting: Family Medicine

## 2024-02-11 DIAGNOSIS — I10 Essential (primary) hypertension: Secondary | ICD-10-CM

## 2024-02-11 NOTE — Patient Instructions (Signed)
 Goals Established by Pt  Goal: switch vitamin water to zero sugar vitamin water.   Goal: go walking 3 days per week for at least 15 minutes.   Goal: eat 3 times per day (even if its a small snack). Meal/Snack tips below. See handout.   Other Tips:  When snacking, aim to include a complex carb and protein.  At meals, aim to include 1/2 plate non-starchy vegetables, 1/4 plate protein, and 1/4 plate complex carbs.

## 2024-02-11 NOTE — Progress Notes (Signed)
 Medical Nutrition Therapy  Appointment Start time:  1118  Appointment End time:  1202  Primary concerns today: weight loss/overall health   Referral diagnosis: E66.01 Preferred learning style: no preference indicated Learning readiness: ready   NUTRITION ASSESSMENT   Anthropometrics   Wt: 230.6 lb  Clinical Medical Hx: HTN Medications: reviewed Labs: reviewed Notable Signs/Symptoms: none Food Allergies: none  Lifestyle & Dietary Hx  Pt states she has been fluctuating between 220 lb and 230 lb for a while and states she felt healthier when she was 210 lb to 220 lb and would like to get back to that. Pt states one of her doctors said she should be 170 lb and she doesn't feel like that would be healthy for her. Pt reports she was recommended starting a weight loss injection but she wanted to focus on nutrition and exercise first.   Pt states she skips breakfast most days. Pt states some days she does not eat at all or some days she may just have a breakfast sandwich. Pt states most days she eats 1 time, and if she snacks she may have some chips. Pt reports sometimes she gets hungry but a lot of the time she isn't. Pt reports she has had issues with throwing up after eating or on an empty stomach, and has not identified a pattern other than high stress.   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 Pt states the rotating and inconsistent schedule makes it hard for her to find balance and consistency. Pt reports because she is around food all day she feels it makes her not want to eat. Pt states her inconsistent schedule also brings stress and she is hoping to get it changed soon.   Pt states she thinks she gets sleep paralysis sometimes. Pt reports some days getting only a few hours of sleep. Pt reports high stress. Pt states she would like to try to get some walking in.    Estimated daily fluid intake: 32-64 oz Supplements: none Sleep: 4-6  hours Stress / self-care: moderate to high Current average weekly physical activity: ADLs  24-Hr Dietary Recall First Meal: [employer] breakfast sandwich Snack: none Second Meal: none Snack: chips Third Meal: none Snack: none Beverages: water, 2 vitamin water, 2 coffee with creamer   NUTRITION DIAGNOSIS  NB-1.1 Food and nutrition-related knowledge deficit As related to lack of prior education by a registered dietitian.  As evidenced by pt report.   NUTRITION INTERVENTION  Nutrition education (E-1) on the following topics:   MyPlate Fruits & Vegetables: Aim to fill half your plate with a variety of fruits and vegetables. They are rich in vitamins, minerals, and fiber, and can help reduce the risk of chronic diseases. Choose a colorful assortment of fruits and vegetables to ensure you get a wide range of nutrients. Grains and Starches: Make at least half of your grain choices whole grains, such as brown rice, whole wheat bread, and oats. Whole grains provide fiber, which aids in digestion and healthy cholesterol levels. Aim for whole forms of starchy vegetables such as potatoes, sweet potatoes, beans, peas, and corn, which are fiber rich and provide many vitamins and minerals.  Protein: Incorporate lean sources of protein, such as poultry, fish, beans, nuts, and seeds, into your meals. Protein is essential for building and repairing tissues, staying full, balancing blood sugar, as well as supporting immune function. Dairy: Include low-fat or fat-free dairy products like milk, yogurt, and  cheese in your diet. Dairy foods are excellent sources of calcium and vitamin D, which are crucial for bone health.   Hydration Adequate hydration is crucial for optimal nutrition and overall health. It aids in the digestion and absorption of nutrients, ensuring the body can effectively process and transport them to cells. Water also supports detoxification by helping the kidneys flush out waste, while  maintaining hydration promotes energy levels and metabolism. Proper hydration helps control appetite and ensures a healthy balance of electrolytes, which are vital for muscle function and cellular processes. Overall, staying well-hydrated is essential for the body to utilize nutrients efficiently and maintain balance.  Sleep Adequate sleep is essential for maintaining overall health and well-being. It plays a critical role in cognitive functions, while also regulating emotions and managing stress. Physically, sleep supports muscle repair, tissue growth, and a robust immune system, reducing the risk of infections and illnesses. It is vital for metabolic health, helping regulate appetite and prevent weight gain, and it contributes to cardiovascular health by maintaining blood pressure and reducing the risk of heart disease. Sleep also influences hormonal balance, enhancing growth, stress regulation, and appetite control. To promote better sleep, it is important to maintain a regular sleep schedule, create a comfortable sleep environment, and engage in relaxing activities before bedtime.   Handouts Provided Include  Plate Method Snack Sheet  Learning Style & Readiness for Change Teaching method utilized: Visual & Auditory  Demonstrated degree of understanding via: Teach Back  Barriers to learning/adherence to lifestyle change: none  Goals Established by Pt  Goal: switch vitamin water to zero sugar vitamin water.   Goal: go walking 3 days per week for at least 15 minutes.   Goal: eat 3 times per day (even if its a small snack). Meal/Snack tips below. See handout.   Other Tips:  When snacking, aim to include a complex carb and protein.  At meals, aim to include 1/2 plate non-starchy vegetables, 1/4 plate protein, and 1/4 plate complex carbs.    MONITORING & EVALUATION Dietary intake, weekly physical activity, and follow up in 4-6 weeks.  Next Steps  Patient is to call for questions.

## 2024-02-15 ENCOUNTER — Ambulatory Visit (INDEPENDENT_AMBULATORY_CARE_PROVIDER_SITE_OTHER): Admitting: Family Medicine

## 2024-02-15 ENCOUNTER — Other Ambulatory Visit (HOSPITAL_COMMUNITY): Payer: Self-pay

## 2024-02-15 VITALS — BP 156/109 | HR 99 | Ht 64.0 in | Wt 233.0 lb

## 2024-02-15 DIAGNOSIS — I10 Essential (primary) hypertension: Secondary | ICD-10-CM

## 2024-02-15 MED ORDER — OLMESARTAN-AMLODIPINE-HCTZ 40-5-12.5 MG PO TABS
1.0000 | ORAL_TABLET | Freq: Every day | ORAL | 1 refills | Status: DC
  Filled 2024-02-15: qty 30, 30d supply, fill #0
  Filled 2024-03-12 – 2024-03-29 (×2): qty 30, 30d supply, fill #1

## 2024-02-15 NOTE — Patient Instructions (Signed)
 Take the amlodipine-olmesartan-and hydrochlorothiazide for your blood pressure.  STOP the verapamil and the Hyzaar.  We will schedule you with Dr. Raymondo Band for blood pressure monitoring.  Come back tomorrow for labs and back to see me in 1 month.

## 2024-02-15 NOTE — Progress Notes (Unsigned)
    SUBJECTIVE:   CHIEF COMPLAINT / HPI:   BP follow-up Previously was seen in office 12/21/2023 with severe hypertension of 157/100 accompanied with concerning weakness on the right side and abnormal heel-to-toe testing.  She was evaluated in the emergency department for concern of endorgan damage/stroke, but CT head was negative for stroke.  Since leaving the hospital, she is confused on which medications to take.  She has been taking Hyzaar over the last week, however.  Today, she denies any neurologic symptoms, chest pain, shortness of breath, headaches.  ?Idiopathic intracranial hypertension As evidenced on recent CT head.  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  No significant change in exam or symptomatology from prior.  Will switch to amlodipine-olmesartan-HCTZ for better control.  Since she has been on the Hyzaar consistently for 1 week, would prefer she get an updated BMP; will schedule lab appointment for this tomorrow, 3/11.  She will be getting repeat labs on 3/19 at the cancer center which will be helpful after being on the triple therapy.  She will follow-up with PCP in 1 month.  Will also send a message to our schedulers to get her scheduled with Dr. Raymondo Band for ambulatory blood pressure monitoring to better elucidate BP.   ?  Idiopathic intracranial hypertension Based on CT head with partial empty sella and tortuous optic nerves.  Would likely benefit from neurology consultation and lumbar puncture to confirm elevated opening pressure.  Patient reassuringly without significant change in vision.  She is already working on weight loss.  If diagnosis confirmed, may benefit from acetazolamide.  Follow-up next visit.  Health maintenance Consider vaccinations at next appointment with PCP.  Janeal Holmes, MD [facility]

## 2024-02-16 ENCOUNTER — Telehealth: Payer: Self-pay

## 2024-02-16 ENCOUNTER — Other Ambulatory Visit: Payer: Self-pay

## 2024-02-16 NOTE — Telephone Encounter (Signed)
 Patient presents to clinic at 1245 regarding questions with lab visit.   She was just now able to pick up olmesartan-amlodipine- hydrochlorothiazide and was not sure when she needed to have labs.   Advised that I would send message to Dr. Phineas Real as I was unsure if he wanted initial labs or if labs were wanted after taking BP medication for period of time.   Patient reports that she is going to go ahead and take BP medication today.   She also states that she has a lab appointment at the [facility] on [facility]. She wanted to have these labs drawn at the same time if this timeframe was okay.   Forwarding to Dr. Phineas Real for further advisement.   Veronda Prude, RN

## 2024-02-16 NOTE — Assessment & Plan Note (Signed)
 Continues to be elevated.  No significant change in exam or symptomatology from prior.  Will switch to amlodipine-olmesartan-HCTZ for better control.  Since she has been on the Hyzaar consistently for 1 week, would prefer she get an updated BMP; will schedule lab appointment for this tomorrow, 3/11.  She will be getting repeat labs on 3/19 at the cancer center which will be helpful after being on the triple therapy.  She will follow-up with PCP in 1 month.  Will also send a message to our schedulers to get her scheduled with Dr. Raymondo Band for ambulatory blood pressure monitoring to better elucidate BP.

## 2024-02-17 NOTE — Telephone Encounter (Signed)
 Called patient. Scheduled lab visit for tomorrow afternoon.   Veronda Prude, RN

## 2024-02-18 ENCOUNTER — Other Ambulatory Visit

## 2024-02-18 DIAGNOSIS — I10 Essential (primary) hypertension: Secondary | ICD-10-CM

## 2024-02-19 ENCOUNTER — Encounter: Payer: Self-pay | Admitting: Family Medicine

## 2024-02-19 ENCOUNTER — Telehealth: Payer: Self-pay | Admitting: Family Medicine

## 2024-02-19 LAB — BASIC METABOLIC PANEL
BUN/Creatinine Ratio: 14 (ref 9–23)
BUN: 10 mg/dL (ref 6–20)
CO2: 24 mmol/L (ref 20–29)
Calcium: 9.8 mg/dL (ref 8.7–10.2)
Chloride: 101 mmol/L (ref 96–106)
Creatinine, Ser: 0.74 mg/dL (ref 0.57–1.00)
Glucose: 112 mg/dL — ABNORMAL HIGH (ref 70–99)
Potassium: 3.6 mmol/L (ref 3.5–5.2)
Sodium: 141 mmol/L (ref 134–144)
eGFR: 107 mL/min/{1.73_m2} (ref >59)

## 2024-02-19 NOTE — Telephone Encounter (Addendum)
 Patient's recent BMP is normal. Messaged admin team to get her scheduled for ambulatory BP monitoring with Dr. Raymondo Band. If continually elevated on triple therapy, would recommend further evaluation with renin/aldo ratio. Could also consider renal US and addition of spironolactone after dose titration of triple therapy.

## 2024-02-24 ENCOUNTER — Inpatient Hospital Stay: Payer: Medicaid Other | Admitting: Internal Medicine

## 2024-02-24 ENCOUNTER — Inpatient Hospital Stay: Payer: Medicaid Other

## 2024-02-26 ENCOUNTER — Ambulatory Visit: Admitting: Pharmacist

## 2024-02-29 NOTE — Progress Notes (Unsigned)
 [facility] OFFICE PROGRESS NOTE  [name], DO [address] [place] [address]  DIAGNOSIS:  1)  iron deficiency anemia secondary to menorrhagia secondary to uterine fibroid. She has intolerance to the oral iron tablets.  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   CURRENT THERAPY:  IV iron with Venofer 300 mg x3 last dose on 09/29/2023   INTERVAL HISTORY: Brittany Graves 38 y.o. female returns to the clinic today for a follow-up visit.  The patient establish care in the clinic with Dr. Arbutus Ped on 09/09/2023 and was last seen in the clinic on 12/18/23.  The patient is followed for anemia secondary to menorrhagia secondary to uterine fibroids.  She has associated heavy clots with her menstrual periods.  She is followed closely by GYN and has previously received Depo-Provera with less bleeding.   Since last being seen, she saw her GYN on 2/29/25 she had endometrial biopsy.   She is feeling a lot better since she was last seen.  Her energy has improved and she is no longer craving ice chips.  She is also no longer sleeping most of the day as she was previously.  She denies any abnormal bleeding or bruising besides the heavy menstrual bleeding.  The patient tried taking over-the-counter ferrous sulfate in early July 2024 but she was not consistent with this due to GI upset.  She has been trying to increase her dietary intake of iron rich food such as liver.   ***Last appointment losartan, PCP sent them to ER.   She is here for evaluation and repeat blood work.      MEDICAL HISTORY: Past Medical History:  Diagnosis Date   Hypertension     ALLERGIES:  has no known allergies.  MEDICATIONS:  Current Outpatient Medications  Medication Sig Dispense Refill   HYDROcodone-acetaminophen (NORCO/VICODIN) 5-325 MG tablet Take 1 tablet by mouth every 6 (six) hours as needed for moderate pain (pain score 4-6) or  severe pain (pain score 7-10). (Patient not taking: Reported on 01/27/2024) 8 tablet 0   nicotine (NICODERM CQ) 21 mg/24hr patch Place 1 patch (21 mg total) onto the skin daily. (Patient not taking: Reported on 01/27/2024) 28 patch 0   Olmesartan-amLODIPine-HCTZ 40-5-12.5 MG TABS Take 1 tablet by mouth daily. 30 tablet 1   polyethylene glycol powder (GLYCOLAX/MIRALAX) 17 GM/SCOOP powder Take 17 g by mouth daily. (Patient not taking: Reported on 01/27/2024) 500 g 0   No current facility-administered medications for this visit.    SURGICAL HISTORY:  Past Surgical History:  Procedure Laterality Date   COLPOSCOPY  10/02/2023   ENDOMETRIAL BIOPSY  01/27/2024   TUBAL LIGATION      REVIEW OF SYSTEMS:   Review of Systems  Constitutional: Negative for appetite change, chills, fatigue, fever and unexpected weight change.  HENT:   Negative for mouth sores, nosebleeds, sore throat and trouble swallowing.   Eyes: Negative for eye problems and icterus.  Respiratory: Negative for cough, hemoptysis, shortness of breath and wheezing.   Cardiovascular: Negative for chest pain and leg swelling.  Gastrointestinal: Negative for abdominal pain, constipation, diarrhea, nausea and vomiting.  Genitourinary: Negative for bladder incontinence, difficulty urinating, dysuria, frequency and hematuria.   Musculoskeletal: Negative for back pain, gait problem, neck pain and neck stiffness.  Skin: Negative for itching and rash.  Neurological: Negative for dizziness, extremity weakness, gait problem, headaches, light-headedness and seizures.  Hematological: Negative for adenopathy. Does not bruise/bleed  easily.  Psychiatric/Behavioral: Negative for confusion, depression and sleep disturbance. The patient is not nervous/anxious.     PHYSICAL EXAMINATION:  Last menstrual period 02/15/2024.  ECOG PERFORMANCE STATUS: {CHL ONC ECOG [id number]  Physical Exam  Constitutional: Oriented to person, place, and time and  well-developed, well-nourished, and in no distress. No distress.  HENT:  Head: Normocephalic and atraumatic.  Mouth/Throat: Oropharynx is clear and moist. No oropharyngeal exudate.  Eyes: Conjunctivae are normal. Right eye exhibits no discharge. Left eye exhibits no discharge. No scleral icterus.  Neck: Normal range of motion. Neck supple.  Cardiovascular: Normal rate, regular rhythm, normal heart sounds and intact distal pulses.   Pulmonary/Chest: Effort normal and breath sounds normal. No respiratory distress. No wheezes. No rales.  Abdominal: Soft. Bowel sounds are normal. Exhibits no distension and no mass. There is no tenderness.  Musculoskeletal: Normal range of motion. Exhibits no edema.  Lymphadenopathy:    No cervical adenopathy.  Neurological: Alert and oriented to person, place, and time. Exhibits normal muscle tone. Gait normal. Coordination normal.  Skin: Skin is warm and dry. No rash noted. Not diaphoretic. No erythema. No pallor.  Psychiatric: Mood, memory and judgment normal.  Vitals reviewed.  LABORATORY DATA: Lab Results  Component Value Date   WBC 6.3 12/21/2023   HGB 12.8 12/21/2023   HCT 39.3 12/21/2023   MCV 83.6 12/21/2023   PLT 139 (L) 12/21/2023      Chemistry      Component Value Date/Time   NA 141 02/18/2024 1531   NA 139 05/15/2012 0853   K 3.6 02/18/2024 1531   K 3.3 (L) 05/15/2012 0853   CL 101 02/18/2024 1531   CL 104 05/15/2012 0853   CO2 24 02/18/2024 1531   CO2 24 05/15/2012 0853   BUN 10 02/18/2024 1531   BUN 4 (L) 05/15/2012 0853   CREATININE 0.74 02/18/2024 1531   CREATININE 0.55 09/09/2023 1147   CREATININE 0.73 05/15/2012 0853      Component Value Date/Time   CALCIUM 9.8 02/18/2024 1531   CALCIUM 9.0 05/15/2012 0853   ALKPHOS 68 12/21/2023 1720   AST 21 12/21/2023 1720   AST 12 (L) 09/09/2023 1147   ALT 19 12/21/2023 1720   ALT 12 09/09/2023 1147   BILITOT 0.6 12/21/2023 1720   BILITOT 0.3 09/09/2023 1147        RADIOGRAPHIC STUDIES:  No results found.   ASSESSMENT/PLAN:  This is a very pleasant 38 year old African-American female with iron deficiency anemia secondary to menorrhagia secondary to uterine fibroids.  She has intolerance to oral iron supplements.  ***MGUS.    She has received IV iron in the past with Venofer 300 mg weekly x 3.  Her most recent dose has been on 09/29/23   Patient had a repeat CBC, CMP, iron studies, and ferritin drawn today.  Labs today show CBC is within normal limits with the exception of her RDW being elevated but it is improved compared to prior.  Her other lab studies are pending at this time.  I will send her a MyChart message with the results of her iron studies and let her know she needs any additional IV iron infusions.   We will see her back for follow-up visit in 2.5-3 months for evaluation and repeat blood work.    She will continue to follow with GYN regarding her heavy menstrual cycles.   She knows if she ever has signs and symptoms of worsening anemia such as fatigue and craving ice chips, that  she is always free to call us sooner for repeat blood work.  The patient was advised to call immediately if she has any concerning symptoms in the interval. The patient voices understanding of current disease status and treatment options and is in agreement with the current care plan. All questions were answered. The patient knows to call the clinic with any problems, questions or concerns. We can certainly see the patient much sooner if necessary    No orders of the defined types were placed in this encounter.    I spent {CHL ONC TIME VISIT - ZOXWR:[id number]} counseling the patient face to face. The total time spent in the appointment was {CHL ONC TIME VISIT - BJYNW:[id number]}.  Quadasia Newsham L Latarra Eagleton, PA-C 02/29/24

## 2024-03-03 ENCOUNTER — Inpatient Hospital Stay: Attending: Internal Medicine

## 2024-03-03 ENCOUNTER — Inpatient Hospital Stay (HOSPITAL_BASED_OUTPATIENT_CLINIC_OR_DEPARTMENT_OTHER): Admitting: Physician Assistant

## 2024-03-03 VITALS — BP 140/105 | HR 87 | Temp 97.7°F | Resp 16 | Wt 323.1 lb

## 2024-03-03 DIAGNOSIS — D500 Iron deficiency anemia secondary to blood loss (chronic): Secondary | ICD-10-CM

## 2024-03-03 DIAGNOSIS — N920 Excessive and frequent menstruation with regular cycle: Secondary | ICD-10-CM | POA: Insufficient documentation

## 2024-03-03 DIAGNOSIS — D259 Leiomyoma of uterus, unspecified: Secondary | ICD-10-CM | POA: Diagnosis not present

## 2024-03-03 DIAGNOSIS — R778 Other specified abnormalities of plasma proteins: Secondary | ICD-10-CM

## 2024-03-03 LAB — CBC WITH DIFFERENTIAL (CANCER CENTER ONLY)
Abs Immature Granulocytes: 0.01 10*3/uL (ref 0.00–0.07)
Basophils Absolute: 0.1 10*3/uL (ref 0.0–0.1)
Basophils Relative: 1 %
Eosinophils Absolute: 0.3 10*3/uL (ref 0.0–0.5)
Eosinophils Relative: 5 %
HCT: 41 % (ref 36.0–46.0)
Hemoglobin: 14.1 g/dL (ref 12.0–15.0)
Immature Granulocytes: 0 %
Lymphocytes Relative: 35 %
Lymphs Abs: 2.1 10*3/uL (ref 0.7–4.0)
MCH: 29.6 pg (ref 26.0–34.0)
MCHC: 34.4 g/dL (ref 30.0–36.0)
MCV: 86 fL (ref 80.0–100.0)
Monocytes Absolute: 0.4 10*3/uL (ref 0.1–1.0)
Monocytes Relative: 6 %
Neutro Abs: 3.3 10*3/uL (ref 1.7–7.7)
Neutrophils Relative %: 53 %
Platelet Count: 140 10*3/uL — ABNORMAL LOW (ref 150–400)
RBC: 4.77 MIL/uL (ref 3.87–5.11)
RDW: 13.9 % (ref 11.5–15.5)
WBC Count: 6.2 10*3/uL (ref 4.0–10.5)
nRBC: 0 % (ref 0.0–0.2)

## 2024-03-03 LAB — FERRITIN: Ferritin: 18 ng/mL (ref 11–307)

## 2024-03-03 LAB — IRON AND IRON BINDING CAPACITY (CC-WL,HP ONLY)
Iron: 51 ug/dL (ref 28–170)
Saturation Ratios: 13 % (ref 10.4–31.8)
TIBC: 388 ug/dL (ref 250–450)
UIBC: 337 ug/dL (ref 148–442)

## 2024-03-03 LAB — LACTATE DEHYDROGENASE: LDH: 185 U/L (ref 98–192)

## 2024-03-04 ENCOUNTER — Telehealth: Payer: Self-pay | Admitting: Internal Medicine

## 2024-03-04 LAB — KAPPA/LAMBDA LIGHT CHAINS
Kappa free light chain: 17 mg/L (ref 3.3–19.4)
Kappa, lambda light chain ratio: 0.94 (ref 0.26–1.65)
Lambda free light chains: 18 mg/L (ref 5.7–26.3)

## 2024-03-04 LAB — BETA 2 MICROGLOBULIN, SERUM: Beta-2 Microglobulin: 1.2 mg/L (ref 0.6–2.4)

## 2024-03-04 NOTE — Telephone Encounter (Signed)
 Scheduled appointments per 3/27 LOS notes. Was unable to leave a voicemail. The patient is active on MyChart and will be mailed an appointment reminder.

## 2024-03-05 ENCOUNTER — Encounter: Payer: Self-pay | Admitting: Obstetrics and Gynecology

## 2024-03-07 ENCOUNTER — Telehealth: Payer: Self-pay | Admitting: Lactation Services

## 2024-03-07 DIAGNOSIS — D251 Intramural leiomyoma of uterus: Secondary | ICD-10-CM

## 2024-03-07 DIAGNOSIS — N939 Abnormal uterine and vaginal bleeding, unspecified: Secondary | ICD-10-CM

## 2024-03-07 NOTE — Telephone Encounter (Signed)
-----   Message from Cutter sent at 03/06/2024  6:12 AM EDT ----- Regarding: IR consult for uterine artery embolization I put in a consult for this last month but I don't see anything in the system. Can you contact Radiology about scheduling this for her? thanks

## 2024-03-07 NOTE — Telephone Encounter (Signed)
 Called and spoke with Collette to schedule IR Consult for Uterine Artery Embolization. Transferred to Imaging at [facility]. They did not answer. LM for them to call the office back to schedule patient.

## 2024-03-08 ENCOUNTER — Other Ambulatory Visit: Payer: Self-pay | Admitting: Physician Assistant

## 2024-03-08 DIAGNOSIS — R778 Other specified abnormalities of plasma proteins: Secondary | ICD-10-CM

## 2024-03-08 LAB — MULTIPLE MYELOMA PANEL, SERUM
Albumin SerPl Elph-Mcnc: 3.7 g/dL (ref 2.9–4.4)
Albumin/Glob SerPl: 1.2 (ref 0.7–1.7)
Alpha 1: 0.2 g/dL (ref 0.0–0.4)
Alpha2 Glob SerPl Elph-Mcnc: 0.7 g/dL (ref 0.4–1.0)
B-Globulin SerPl Elph-Mcnc: 1 g/dL (ref 0.7–1.3)
Gamma Glob SerPl Elph-Mcnc: 1.3 g/dL (ref 0.4–1.8)
Globulin, Total: 3.3 g/dL (ref 2.2–3.9)
IgA: 160 mg/dL (ref 87–352)
IgG (Immunoglobin G), Serum: 1421 mg/dL (ref 586–1602)
IgM (Immunoglobulin M), Srm: 128 mg/dL (ref 26–217)
M Protein SerPl Elph-Mcnc: 0.5 g/dL — ABNORMAL HIGH
Total Protein ELP: 7 g/dL (ref 6.0–8.5)

## 2024-03-10 NOTE — Addendum Note (Signed)
 Addended by: Ed Blalock on: 03/10/2024 09:19 AM   Modules accepted: Orders

## 2024-03-10 NOTE — Telephone Encounter (Signed)
 Called and spoke with Victorino Dike, They usually call the patient themselves to get the information. Order placed and was informed they will call the patient. Will let patient know via Mychart.

## 2024-03-14 ENCOUNTER — Other Ambulatory Visit: Payer: Self-pay | Admitting: Interventional Radiology

## 2024-03-14 DIAGNOSIS — N939 Abnormal uterine and vaginal bleeding, unspecified: Secondary | ICD-10-CM

## 2024-03-14 DIAGNOSIS — D251 Intramural leiomyoma of uterus: Secondary | ICD-10-CM

## 2024-03-18 ENCOUNTER — Ambulatory Visit: Admitting: Dietician

## 2024-03-21 ENCOUNTER — Encounter: Payer: Self-pay | Admitting: Pharmacist

## 2024-03-21 ENCOUNTER — Ambulatory Visit: Admitting: Pharmacist

## 2024-03-21 VITALS — BP 142/106 | HR 88 | Wt 230.0 lb

## 2024-03-21 DIAGNOSIS — I10 Essential (primary) hypertension: Secondary | ICD-10-CM

## 2024-03-21 NOTE — Progress Notes (Signed)
   S:     Chief Complaint  Patient presents with   Medication Management    Amb BP Monitor - Day #1   38 y.o. female who presents for hypertension evaluation, education, and management. Patient arrives in good spirits and presents without any assistance.   PMH is significant for HTN, iron deficiency anemia, migraines, and stroke (06/2023).  Patient was referred and last seen by, Dr. Phineas Real, on 02/15/2024.   Diagnosed with Hypertension in the year of 2024, however has had history of high blood pressure for several years prior to official diagnosis.    Medication compliance is reported to be mostly compliant, misses 1-2 days a week. Patient did not take her dose this morning prior to her appointment, did bring medication with her and took it during appointment.  Discussed procedure for wearing the monitor and gave patient written instructions. Monitor was placed on non-dominant arm with instructions to return in the morning.   Current BP Medications include: olmesartan-amlodipine-hydrochlorothiazide 40-5-12.5 mg  Antihypertensives tried in the past include: verapamil, losartan-hydrochlorothiazide   O:   Last 3 Office BP readings: BP Readings from Last 3 Encounters:  03/21/24 (!) 142/106  03/03/24 (!) 140/105  02/15/24 (!) 156/109    Clinical Atherosclerotic Cardiovascular Disease (ASCVD): No  The ASCVD Risk score (Arnett DK, et al., 2019) failed to calculate for the following reasons:   The 2019 ASCVD risk score is only valid for ages 34 to 62   Risk score cannot be calculated because patient has a medical history suggesting prior/existing ASCVD  Basic Metabolic Panel    Component Value Date/Time   NA 141 02/18/2024 1531   NA 139 05/15/2012 0853   K 3.6 02/18/2024 1531   K 3.3 (L) 05/15/2012 0853   CL 101 02/18/2024 1531   CL 104 05/15/2012 0853   CO2 24 02/18/2024 1531   CO2 24 05/15/2012 0853   GLUCOSE 112 (H) 02/18/2024 1531   GLUCOSE 114 (H) 12/21/2023 1720    GLUCOSE 98 05/15/2012 0853   BUN 10 02/18/2024 1531   BUN 4 (L) 05/15/2012 0853   CREATININE 0.74 02/18/2024 1531   CREATININE 0.55 09/09/2023 1147   CREATININE 0.73 05/15/2012 0853   CALCIUM 9.8 02/18/2024 1531   CALCIUM 9.0 05/15/2012 0853   GFRNONAA >60 12/21/2023 1720   GFRNONAA >60 09/09/2023 1147   GFRNONAA >60 05/15/2012 0853   GFRAA >60 02/03/2018 1000   GFRAA >60 05/15/2012 0853    Renal function: CrCl cannot be calculated (Patient's most recent lab result is older than the maximum 21 days allowed.).   ABPM Study Data: Arm Placement left arm   For Office Goal BP of <130/80 mmHg:  ABPM thresholds: Overall BP <125/75 mmHg, daytime BP <130/80 mmHg, sleeptime BP <110/65 mmHg    A/P: History of hypertension uncontrolled diagnosed in 06/2023 currently taking olmesartan-amlodipine-hydrochlorothiazide 40-5-12.5 mg with goal presssure of <130/80.     -Placed blood pressure cuff, provided education, patient instructed to wear cuff for 24 hours and return tomorrow to review results.   Written patient instructions provided including activity/symptom/event log. Patient verbalized understanding of plan.  Total time in face to face counseling 27 minutes.    Follow-up: Tomorrow AM - early morning appointment 8:30  Patient seen with Threasa Heads, PharmD Candidate and Darolyn Rua, PharmD Candidate.

## 2024-03-21 NOTE — Patient Instructions (Signed)
Blood Pressure Activity Diary Time Lying down/ Sleeping Walking/ Exercise Stressed/ Angry Headache/ Pain Dizzy  9 AM       10 AM       11 AM       12 PM       1 PM       2 PM       Time Lying down/ Sleeping Walking/ Exercise Stressed/ Angry Headache/ Pain Dizzy  3 PM       4 PM        5 PM       6 PM       7 PM       8 PM       Time Lying down/ Sleeping Walking/ Exercise Stressed/ Angry Headache/ Pain Dizzy  9 PM       10 PM       11 PM       12 AM       1 AM       2 AM       3 AM       Time Lying down/ Sleeping Walking/ Exercise Stressed/ Angry Headache/ Pain Dizzy  4 AM       5 AM       6 AM       7 AM       8 AM       9 AM       10 AM        Time you woke up: _________                  Time you went to sleep:__________  Come back tomorrow at 8:30 to have the monitor removed  Call the [facility] if you have any questions before then ([phone])  Wearing the Blood Pressure Monitor The cuff will inflate every 20 minutes during the day and every 30 minutes while you sleep. Fill out the blood pressure-activity diary during the day, especially during activities that may affect your reading -- such as exercise, stress, walking, taking your blood pressure medications  Important things to know: Avoid taking the monitor off for the next 24 hours, unless it causes you discomfort or pain. Do NOT get the monitor wet and do NOT try to clean the monitor with any cleaning products. Do NOT put the monitor on anyone else's arm. When the cuff inflates, avoid excess movement. Let the cuffed arm hang loosely, slightly away from the body. Avoid flexing the muscles or moving the hand/fingers. Remember to fill out the blood pressure activity diary. If you experience severe pain or unusual pain (not associated with getting your blood pressure checked), remove the monitor.  Troubleshooting:  Code  Troubleshooting   1  Check cuff position, tighten cuff   2, 3  Remain still  during reading   4, 87  Check air hose connections and make sure cuff is tight   85, 89  Check hose connections and make tubing is not crimped   86  Push START/STOP to restart reading   88, 91  Retry by pushing START/STOP   90  Replace batteries. If problem persists, remove monitor and bring back to   clinic at follow up   97, 98, 99  Service required - Remove monitor and bring back to clinic at follow up

## 2024-03-21 NOTE — Progress Notes (Signed)
 Reviewed and agree with Dr Macky Lower plan.

## 2024-03-21 NOTE — Assessment & Plan Note (Signed)
 History of hypertension uncontrolled diagnosed in 06/2023 currently taking olmesartan-amlodipine-hydrochlorothiazide 40-5-12.5 mg with goal presssure of <130/80.     -Placed blood pressure cuff, provided education, patient instructed to wear cuff for 24 hours and return tomorrow to review results.

## 2024-03-22 ENCOUNTER — Ambulatory Visit: Admitting: Pharmacist

## 2024-03-22 ENCOUNTER — Encounter: Payer: Self-pay | Admitting: Pharmacist

## 2024-03-22 ENCOUNTER — Other Ambulatory Visit (HOSPITAL_COMMUNITY): Payer: Self-pay

## 2024-03-22 VITALS — BP 123/84 | Wt 233.0 lb

## 2024-03-22 DIAGNOSIS — I10 Essential (primary) hypertension: Secondary | ICD-10-CM

## 2024-03-22 MED ORDER — EPLERENONE 25 MG PO TABS
25.0000 mg | ORAL_TABLET | Freq: Every day | ORAL | 11 refills | Status: AC
  Filled 2024-03-22 – 2024-03-28 (×2): qty 30, 30d supply, fill #0
  Filled 2024-07-04: qty 30, 30d supply, fill #1

## 2024-03-22 NOTE — Patient Instructions (Signed)
 It was nice to see you today!  Your goal blood pressure is <130/80 mmHg.  Medication Changes: START eplerenone 25 mg 1 tablet once daily.  Continue all other medication the same.   Keep up the good work with diet and exercise. Aim for a diet full of vegetables, fruit and lean meats (chicken, [place], fish). Try to limit salt intake by eating fresh or frozen vegetables (instead of canned), rinse canned vegetables prior to cooking and do not add any additional salt to meals.

## 2024-03-22 NOTE — Progress Notes (Signed)
 S:     Chief Complaint  Patient presents with   Medication Management    Amb BP Monitoring Day #2   38 y.o. female who presents for hypertension evaluation, education, and management.  Patient arrives in good spirits and presents without any assistance.   PMH is significant for HTN, iron deficiency anemia, migraines, and stroke (06/2023).   Patient reports they were able to wear the Ambulatory Blood Pressure Cuff for the entire 24 evaluation period.   O:  Review of Systems  Constitutional:  Positive for malaise/fatigue.  All other systems reviewed and are negative.   Physical Exam Vitals reviewed.  Constitutional:      Appearance: Normal appearance.  Pulmonary:     Effort: Pulmonary effort is normal.  Neurological:     Mental Status: She is alert.  Psychiatric:        Mood and Affect: Mood normal.        Behavior: Behavior normal.        Thought Content: Thought content normal.        Judgment: Judgment normal.     Last 3 Office BP readings: BP Readings from Last 3 Encounters:  03/21/24 (!) 142/106  03/03/24 (!) 140/105  02/15/24 (!) 156/109    Clinical Atherosclerotic Cardiovascular Disease (ASCVD): Yes  The ASCVD Risk score (Arnett DK, et al., 2019) failed to calculate for the following reasons:   The 2019 ASCVD risk score is only valid for ages 84 to 85   Risk score cannot be calculated because patient has a medical history suggesting prior/existing ASCVD  Basic Metabolic Panel    Component Value Date/Time   NA 141 02/18/2024 1531   NA 139 05/15/2012 0853   K 3.6 02/18/2024 1531   K 3.3 (L) 05/15/2012 0853   CL 101 02/18/2024 1531   CL 104 05/15/2012 0853   CO2 24 02/18/2024 1531   CO2 24 05/15/2012 0853   GLUCOSE 112 (H) 02/18/2024 1531   GLUCOSE 114 (H) 12/21/2023 1720   GLUCOSE 98 05/15/2012 0853   BUN 10 02/18/2024 1531   BUN 4 (L) 05/15/2012 0853   CREATININE 0.74 02/18/2024 1531   CREATININE 0.55 09/09/2023 1147   CREATININE 0.73  05/15/2012 0853   CALCIUM 9.8 02/18/2024 1531   CALCIUM 9.0 05/15/2012 0853   GFRNONAA >60 12/21/2023 1720   GFRNONAA >60 09/09/2023 1147   GFRNONAA >60 05/15/2012 0853   GFRAA >60 02/03/2018 1000   GFRAA >60 05/15/2012 0853    Renal function: CrCl cannot be calculated (Patient's most recent lab result is older than the maximum 21 days allowed.).   ABPM Study Data: Arm Placement left arm  Overall Mean 24hr BP:   131/83 mmHg  HR: 92  Daytime Mean BP:  134/87 mmHg  HR: 105  Nighttime Mean BP:  126/77 mmHg  HR: 73  Dipping Pattern: Yes  Sys:   6.3   Dia: 11.8   [normal dipping ~10-20%]   For Office Goal BP of <130/80 mmHg:  ABPM thresholds: Overall BP <125/75 mmHg, daytime BP <130/80 mmHg, sleeptime BP <110/65 mmHg    Patient is participating in a Managed Medicaid Plan:  Yes   A/P: History of hypertension uncontrolled longstanding 1 year (2024) currently taking olmesartan-amlodipine-hydrochlorothiazide 40-5-12.5 mg; with goal pressure of <130/80. Found to have persistently elevated and uncontrolled blood pressure with 24-hour ambulatory blood pressure evaluation which demonstrates an average AWAKE blood pressure of 134/87 mmHg. Nocturnal dipping pattern is abnormal for systolic.   Changes to medications -  Continued olmesartan-amlodipine-hydrochlorothiazide 40-5-12.5 mg daily -Started eplerenone 25 mg daily -BMET at follow-up  Results reviewed and written information provided.    Patient reports wanting to start weight loss medication. Plan to discuss at next appointment  Written patient instructions provided. Patient verbalized understanding of treatment plan.  Total time in face to face counseling 27 minutes.    Follow-up:  Pharmacist Dr. Florestine Carmical 04/07/24 PCP clinic visit in [place]  Patient seen with Teofilo Fellers, PharmD Candidate.

## 2024-03-22 NOTE — Assessment & Plan Note (Signed)
 History of hypertension uncontrolled longstanding 1 year (2024) currently taking olmesartan-amlodipine-hydrochlorothiazide 40-5-12.5 mg; with goal pressure of <130/80. Found to have persistently elevated and uncontrolled blood pressure with 24-hour ambulatory blood pressure evaluation which demonstrates an average AWAKE blood pressure of 134/87 mmHg. Nocturnal dipping pattern is abnormal for systolic.   Changes to medications -Continued olmesartan-amlodipine-hydrochlorothiazide 40-5-12.5 mg daily -Started eplerenone 25 mg daily

## 2024-03-23 ENCOUNTER — Other Ambulatory Visit (HOSPITAL_COMMUNITY): Payer: Self-pay

## 2024-03-23 NOTE — Progress Notes (Signed)
 Reviewed and agree with Dr Macky Lower plan.

## 2024-03-24 ENCOUNTER — Other Ambulatory Visit (HOSPITAL_COMMUNITY): Payer: Self-pay

## 2024-03-25 ENCOUNTER — Other Ambulatory Visit (HOSPITAL_COMMUNITY): Payer: Self-pay

## 2024-03-28 ENCOUNTER — Other Ambulatory Visit (HOSPITAL_COMMUNITY): Payer: Self-pay

## 2024-03-29 ENCOUNTER — Other Ambulatory Visit (HOSPITAL_COMMUNITY): Payer: Self-pay

## 2024-03-29 ENCOUNTER — Ambulatory Visit: Admitting: Pharmacist

## 2024-04-04 ENCOUNTER — Encounter: Payer: Self-pay | Admitting: *Deleted

## 2024-04-07 ENCOUNTER — Ambulatory Visit: Admitting: Pharmacist

## 2024-04-24 ENCOUNTER — Inpatient Hospital Stay: Admission: RE | Admit: 2024-04-24 | Source: Ambulatory Visit

## 2024-04-27 ENCOUNTER — Ambulatory Visit: Admitting: Obstetrics and Gynecology

## 2024-04-27 ENCOUNTER — Other Ambulatory Visit: Payer: Self-pay

## 2024-04-27 VITALS — BP 146/102 | HR 78 | Wt 229.0 lb

## 2024-04-27 DIAGNOSIS — Z1331 Encounter for screening for depression: Secondary | ICD-10-CM

## 2024-04-27 DIAGNOSIS — N946 Dysmenorrhea, unspecified: Secondary | ICD-10-CM | POA: Diagnosis not present

## 2024-04-27 DIAGNOSIS — N83292 Other ovarian cyst, left side: Secondary | ICD-10-CM

## 2024-04-27 DIAGNOSIS — N921 Excessive and frequent menstruation with irregular cycle: Secondary | ICD-10-CM

## 2024-04-27 DIAGNOSIS — N920 Excessive and frequent menstruation with regular cycle: Secondary | ICD-10-CM

## 2024-04-27 DIAGNOSIS — I10 Essential (primary) hypertension: Secondary | ICD-10-CM

## 2024-04-27 DIAGNOSIS — D219 Benign neoplasm of connective and other soft tissue, unspecified: Secondary | ICD-10-CM

## 2024-04-27 NOTE — Progress Notes (Signed)
 Obstetrics and Gynecology Visit Return Patient Evaluation  Appointment Date: 04/27/2024  Primary Care Provider: Clyda Dark  OBGYN Clinic: [facility]  Chief Complaint: follow up fibroids, heavy and painful periods  History of Present Illness:  Brittany Graves is a 37 y.o. P3 with above CC. 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  Patient last received depo on 08/2023 and 01/27/2024. She's had AUB while on the depo but has stopped recently. She needs to reschedule her MRI with Radiology. I spoke to [facility] surgeons in my group and felt that her fibroids were not amenable to the procedure. Embx Feb 2024 negative (benign endo with mixed hormone effect)  Interval History: Since that time, she states that her AUB from the depo has stopped. Patient forgot to take BP meds today  Review of Systems: as noted in the History of Present Illness.  Patient Active Problem List   Diagnosis Date Noted   BMI 40.0-44.9, adult (HCC) 12/22/2023   Abnormal SPEP 12/18/2023   Complex cyst of left ovary 10/02/2023   Breakthrough bleeding on depo provera  10/02/2023   ASCUS with positive high risk HPV cervical 08/20/2023   Chronic migraine w/o aura w/o status migrainosus, not intractable 08/18/2023   Intramural leiomyoma of uterus 08/13/2023   Dysmenorrhea 08/12/2023   Menorrhagia 07/09/2023   Work stress 07/09/2023   Iron  deficiency anemia 06/19/2023   Encounter for smoking cessation counseling 06/19/2023   HTN (hypertension) 06/18/2023   Headache 06/18/2023   Stroke (HCC) 06/18/2023   Weakness 06/18/2023   Medications:  Jocelyn L. Elford had no medications administered during this visit. Current Outpatient  Medications  Medication Sig Dispense Refill   eplerenone  (INSPRA ) 25 MG tablet Take 1 tablet (25 mg total) by mouth daily. 30 tablet 11   Olmesartan -amLODIPine -HCTZ 40-5-12.5 MG TABS Take 1 tablet by mouth daily. 30 tablet 1   No current facility-administered medications for this visit.    Allergies: has no known allergies.  Physical Exam:  BP (!) 146/102   Pulse 78   Wt 229 lb (103.9 kg)   BMI 39.31 kg/m  Body mass index is 39.31 kg/m. General appearance: Well nourished, well developed female in no acute distress.  Abdomen: diffusely non tender to palpation, non distended, and no masses, hernias Neuro/Psych:  Normal mood and affect.    Radiology: no new imaging Narrative & Impression  CLINICAL DATA:  menorrhagia and mass/fibroid felt during bimanual exam   EXAM: ULTRASOUND OF PELVIS   TECHNIQUE: Transabdominal and transvaginalultrasound examination of the pelvis was performed including evaluation of the uterus, ovaries, adnexal regions, and pelvic cul-de-sac.   COMPARISON:  None Available.   FINDINGS: Uterusanteverted 13 x 11 x 10 cm. The endometrium 0.6 cm. The uterine cavity appears empty.   The following uterine fibroids were identified.   1. Fundal subserosal, 7.3 x 6.4 x 6.1 cm. 2. Anterior left subserosal, 6.8 x 4.7 x 4.5 cm. 3. Left intramural, 5.1 x 4.0 x 3.9 cm.   Right ovary   Unremarkable, 3.5 x 2.1 x 1.8 cm.   Left ovary   Complex cyst consistent with a hemorrhagic follicle measuring 3.2 cm. Left ovary measurements are 4.7 x 4.1 x 7.5 cm.   Images of the adnexae demonstrated no  additional masses or fluid collections   IMPRESSION: 1. Uterine fibroids, described above. 2. Complex cyst left ovary. This can be followed up in 6-12 months with repeat sonography to ensure resolution.     Electronically Signed   By: Sydell Eva M.D.   On: 07/26/2023 18:34    Assessment: patient stable  Plan:  1. Complex cyst of left ovary  (Primary) D/w her need for follow up which can be done with her MRI. Follow up on MRI  2. Fibroid D/w her importance of rescheduling her MRI for IR to consult on pros/cons/feasibility of IR [place]. I told her that her options are limited and she doesn't really have any other medical options with only Mirena left but u/s guidance would be needed for placement. In terms of surgery, she has had a BTL and confirms she is done with having children. I told her that, surgically, [place] and hyst are the only two options left and hyst is a major surgery and, if considered, she would need to make sure her various co-morbidities are optimized.   3. Dysmenorrhea See above  4. Menorrhagia with regular cycle See above  5. Breakthrough bleeding on depo provera  See above. Recommend holding off on another depo provera  injection today.   6. Hypertension, unspecified type  7. Hematology Followed by Heme.     Latest Ref Rng & Units 03/03/2024    8:50 AM 12/21/2023    5:20 PM 12/18/2023    8:54 AM  CBC  WBC 4.0 - 10.5 K/uL 6.2  6.3  6.5   Hemoglobin 12.0 - 15.0 g/dL 87.5  64.3  32.9   Hematocrit 36.0 - 46.0 % 41.0  39.3  40.8   Platelets 150 - 400 K/uL 140  139  172    8. History of CIN 1 Repeat pap and hpv October 2025  Return in about 2 months (around 06/27/2024) for in person, with dr Aquilla Knapp.  Future Appointments  Date Time Provider Department Center  07/04/2024  3:00 PM [facility] None  07/04/2024  3:30 PM Marlene Simas, MD [facility] None    Tyler Gallant MD Attending Center for [facility])

## 2024-07-04 ENCOUNTER — Other Ambulatory Visit: Payer: Self-pay

## 2024-07-04 ENCOUNTER — Inpatient Hospital Stay: Attending: Internal Medicine

## 2024-07-04 ENCOUNTER — Inpatient Hospital Stay (HOSPITAL_BASED_OUTPATIENT_CLINIC_OR_DEPARTMENT_OTHER): Admitting: Internal Medicine

## 2024-07-04 ENCOUNTER — Other Ambulatory Visit: Payer: Self-pay | Admitting: Family Medicine

## 2024-07-04 VITALS — BP 162/113 | HR 89 | Temp 98.1°F | Resp 17 | Ht 64.0 in | Wt 229.0 lb

## 2024-07-04 DIAGNOSIS — N920 Excessive and frequent menstruation with regular cycle: Secondary | ICD-10-CM | POA: Insufficient documentation

## 2024-07-04 DIAGNOSIS — D500 Iron deficiency anemia secondary to blood loss (chronic): Secondary | ICD-10-CM | POA: Insufficient documentation

## 2024-07-04 DIAGNOSIS — I10 Essential (primary) hypertension: Secondary | ICD-10-CM | POA: Diagnosis not present

## 2024-07-04 DIAGNOSIS — D259 Leiomyoma of uterus, unspecified: Secondary | ICD-10-CM | POA: Insufficient documentation

## 2024-07-04 DIAGNOSIS — R778 Other specified abnormalities of plasma proteins: Secondary | ICD-10-CM

## 2024-07-04 LAB — IRON AND IRON BINDING CAPACITY (CC-WL,HP ONLY)
Iron: 81 ug/dL (ref 28–170)
Saturation Ratios: 20 % (ref 10.4–31.8)
TIBC: 396 ug/dL (ref 250–450)
UIBC: 315 ug/dL (ref 148–442)

## 2024-07-04 LAB — CBC WITH DIFFERENTIAL (CANCER CENTER ONLY)
Abs Immature Granulocytes: 0.02 K/uL (ref 0.00–0.07)
Basophils Absolute: 0.1 K/uL (ref 0.0–0.1)
Basophils Relative: 1 %
Eosinophils Absolute: 0.2 K/uL (ref 0.0–0.5)
Eosinophils Relative: 3 %
HCT: 41.5 % (ref 36.0–46.0)
Hemoglobin: 14.1 g/dL (ref 12.0–15.0)
Immature Granulocytes: 0 %
Lymphocytes Relative: 36 %
Lymphs Abs: 2.2 K/uL (ref 0.7–4.0)
MCH: 28.7 pg (ref 26.0–34.0)
MCHC: 34 g/dL (ref 30.0–36.0)
MCV: 84.3 fL (ref 80.0–100.0)
Monocytes Absolute: 0.3 K/uL (ref 0.1–1.0)
Monocytes Relative: 5 %
Neutro Abs: 3.3 K/uL (ref 1.7–7.7)
Neutrophils Relative %: 55 %
Platelet Count: 249 K/uL (ref 150–400)
RBC: 4.92 MIL/uL (ref 3.87–5.11)
RDW: 14.1 % (ref 11.5–15.5)
WBC Count: 6 K/uL (ref 4.0–10.5)
nRBC: 0 % (ref 0.0–0.2)

## 2024-07-04 LAB — LACTATE DEHYDROGENASE: LDH: 163 U/L (ref 98–192)

## 2024-07-04 MED ORDER — OLMESARTAN-AMLODIPINE-HCTZ 40-5-12.5 MG PO TABS
1.0000 | ORAL_TABLET | Freq: Every day | ORAL | 2 refills | Status: AC
  Filled 2024-07-04: qty 30, 30d supply, fill #0

## 2024-07-04 NOTE — Progress Notes (Signed)
 [name] [phone]   [phone]  OFFICE PROGRESS NOTE  Brittany Ferrier, DO [address] [place] [address]  DIAGNOSIS:  1)  iron  deficiency anemia secondary to menorrhagia secondary to uterine fibroid. She has intolerance to the oral iron  tablets.  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    CURRENT THERAPY:  IV iron  with Venofer  300 mg x3 last dose on 09/29/2023   INTERVAL HISTORY: Brittany Graves 38 y.o. female returns to the clinic today for follow-up visit. Discussed the use of AI scribe software for clinical note transcription with the patient, who gave verbal consent to proceed.  History of Present Illness Brittany Graves is a 37 year old female with iron  deficiency anemia secondary to menorrhagia and uterine fibroids who presents for follow-up.  She has a history of iron  deficiency anemia due to menorrhagia from uterine fibroids. She is intolerant to oral iron  tablets and has been treated with Venofer  infusions. Recently, she has experienced fatigue, which she attributes to stress, although her energy levels have generally been stable. She does not consume ice, stating she 'can't stand the taste of ice ever since' her infusions.  Her menstrual bleeding has been problematic, with a Depo shot failing to alleviate her symptoms and instead prolonging her menstrual period to three months. Her last menstrual cycle began on the 28th or 1st of the previous month and lasted until July 4th, with heavy bleeding during the first two to three days.  She has a history of monoclonal gammopathy of undetermined significance (MGUS), diagnosed in October 2024. She expresses some confusion about the condition and its implications.  She has been off her blood pressure medication for two weeks.     MEDICAL HISTORY: Past Medical History:  Diagnosis Date   Hypertension      ALLERGIES:  has no known allergies.  MEDICATIONS:  Current Outpatient Medications  Medication Sig Dispense Refill   eplerenone  (INSPRA ) 25 MG tablet Take 1 tablet (25 mg total) by mouth daily. 30 tablet 11   Olmesartan -amLODIPine -HCTZ 40-5-12.5 MG TABS Take 1 tablet by mouth daily. 30 tablet 1   No current facility-administered medications for this visit.    SURGICAL HISTORY:  Past Surgical History:  Procedure Laterality Date   COLPOSCOPY  10/02/2023   ENDOMETRIAL BIOPSY  01/27/2024   TUBAL LIGATION      REVIEW OF SYSTEMS:  A comprehensive review of systems was negative except for: Constitutional: positive for fatigue   PHYSICAL EXAMINATION: General appearance: alert, cooperative, fatigued, and no distress Head: Normocephalic, without obvious abnormality, atraumatic Neck: no adenopathy, no JVD, supple, symmetrical, trachea midline, and thyroid  not enlarged, symmetric, no tenderness/mass/nodules Lymph nodes: Cervical, supraclavicular, and axillary nodes normal. Resp: clear to auscultation bilaterally Back: symmetric, no curvature. ROM normal. No CVA tenderness. Cardio: regular rate and rhythm, S1, S2 normal, no murmur, click, rub or gallop GI: soft, non-tender; bowel sounds normal; no masses,  no organomegaly Extremities: extremities normal, atraumatic, no cyanosis or edema  ECOG PERFORMANCE STATUS: 1 - Symptomatic but completely ambulatory  Blood pressure (!) 162/113, pulse 89, temperature 98.1 F (36.7 C), temperature source Temporal, resp. rate 17, height 5' 4 (1.626 m), weight 229 lb (103.9 kg), SpO2 100%.  LABORATORY DATA: Lab Results  Component Value Date   WBC 6.0 07/04/2024   HGB 14.1 07/04/2024   HCT 41.5 07/04/2024   MCV 84.3 07/04/2024   PLT  249 07/04/2024      Chemistry      Component Value Date/Time   NA 141 02/18/2024 1531   NA 139 05/15/2012 0853   K 3.6 02/18/2024 1531   K 3.3 (L) 05/15/2012 0853   CL 101 02/18/2024 1531   CL 104 05/15/2012  0853   CO2 24 02/18/2024 1531   CO2 24 05/15/2012 0853   BUN 10 02/18/2024 1531   BUN 4 (L) 05/15/2012 0853   CREATININE 0.74 02/18/2024 1531   CREATININE 0.55 09/09/2023 1147   CREATININE 0.73 05/15/2012 0853      Component Value Date/Time   CALCIUM 9.8 02/18/2024 1531   CALCIUM 9.0 05/15/2012 0853   ALKPHOS 68 12/21/2023 1720   AST 21 12/21/2023 1720   AST 12 (L) 09/09/2023 1147   ALT 19 12/21/2023 1720   ALT 12 09/09/2023 1147   BILITOT 0.6 12/21/2023 1720   BILITOT 0.3 09/09/2023 1147       RADIOGRAPHIC STUDIES: No results found.  ASSESSMENT AND PLAN: This is a very pleasant 38 years old African-American female with  iron  deficiency anemia secondary to menorrhagia secondary to uterine fibroid. She has intolerance to the oral iron  tablets.  She also has possible MGUS, abnormal SPEP in October 2024.  The patient is currently on observation in addition to iron  infusion on as-needed basis. Assessment and Plan Assessment & Plan Iron  deficiency anemia secondary to menorrhagia due to uterine fibroid Hemoglobin is stable at 14.1. Current menstrual bleeding is prolonged with heavy flow for the first 2-3 days despite Depo shot treatment. - Await iron  and ferritin levels - If iron  levels are low, arrange for iron  infusion - Follow up in 3-4 months if iron  levels are normal  Intolerance to oral iron  tablets Intolerance to oral iron  tablets, previously managed with Venofer  infusion.  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 Current status involves elevated monoclonal protein of undetermined significance. Discussed potential progression to multiple myeloma at a rate of 1% per year. - Continue regular monitoring of protein levels  Hypertension Currently uncontrolled due to non-adherence to medication. Blood pressure is very high today. - Emphasized the importance of consistent medication adherence to prevent complications such as stroke or heart disease She was advised to call  immediately if she has any other concerning symptoms in the interval. The patient voices understanding of current disease status and treatment options and is in agreement with the current care plan.  All questions were answered. The patient knows to call the clinic with any problems, questions or concerns. We can certainly see the patient much sooner if necessary.  The total time spent in the appointment was 20 minutes including review of chart and various tests results, discussions about plan of care and coordination of care plan .   Disclaimer: This note was dictated with voice recognition software. Similar sounding words can inadvertently be transcribed and may not be corrected upon review.

## 2024-07-05 ENCOUNTER — Other Ambulatory Visit (HOSPITAL_COMMUNITY): Payer: Self-pay

## 2024-07-05 LAB — FERRITIN: Ferritin: 22 ng/mL (ref 11–307)

## 2024-07-05 LAB — KAPPA/LAMBDA LIGHT CHAINS
Kappa free light chain: 16.3 mg/L (ref 3.3–19.4)
Kappa, lambda light chain ratio: 0.82 (ref 0.26–1.65)
Lambda free light chains: 20 mg/L (ref 5.7–26.3)

## 2024-07-05 LAB — BETA 2 MICROGLOBULIN, SERUM: Beta-2 Microglobulin: 1.3 mg/L (ref 0.6–2.4)

## 2024-07-05 NOTE — Assessment & Plan Note (Signed)
 Patient's blood pressure {is/is not:320031} controlled today.  . Goal of 130/80. Patient's medication regimen includes olmesartan -amlodipine -hydrochlorothiazide  40-5-12.5 mg daily and eplerenone  25 mg daily. - Changes to current regimen include *** - Referral to pharmacy clinic due to {KOVALBPCRITERIA:29903} - Labs: BMP, urine microalbumin/creatinine *** - Follow up in *** weeks

## 2024-07-05 NOTE — Progress Notes (Unsigned)
    SUBJECTIVE:   CHIEF COMPLAINT / HPI:   Hypertension - Not been taking combo med, but taking the Eplerenone  - Hasn't been checking BP at home either - Going to pick up meds at pharmacy today  PERTINENT  PMH / PSH: Menorrhagia, IDA, Stroke  OBJECTIVE:   BP (!) 146/105   Pulse 92   Ht 5' 6 (1.676 m)   Wt 229 lb 6.4 oz (104.1 kg)   SpO2 100%   BMI 37.03 kg/m   General: Awake and Alert in NAD HEENT: NCAT. Sclera anicteric. No rhinorrhea. Cardiovascular: RRR. No M/R/G Respiratory: CTAB, normal WOB on RA. No wheezing, crackles, rhonchi, or diminished breath sounds. Abdomen: Soft, non-tender, non-distended. Bowel sounds normoactive Extremities: Able to move all extremities. No BLE edema, no deformities or significant joint findings. Skin: Warm and dry. No abrasions or rashes noted. Neuro: A&Ox3. No focal neurological deficits.  ASSESSMENT/PLAN:   Assessment & Plan Primary hypertension Patient's blood pressure is not controlled today. BP: (!) 146/105. Goal of 130/80. Patient's medication regimen includes olmesartan -amlodipine -hydrochlorothiazide  40-5-12.5 mg daily and eplerenone  25 mg daily. - No changes today, advised compliance with medications; will increase if needed in 2 weeks - Labs: BMP in 2 weeks - Follow up in 2 weeks  Kathrine Melena, DO [facility]

## 2024-07-06 ENCOUNTER — Ambulatory Visit: Admitting: Family Medicine

## 2024-07-06 ENCOUNTER — Encounter: Payer: Self-pay | Admitting: Family Medicine

## 2024-07-06 VITALS — BP 146/105 | HR 92 | Ht 66.0 in | Wt 229.4 lb

## 2024-07-06 DIAGNOSIS — I10 Essential (primary) hypertension: Secondary | ICD-10-CM

## 2024-07-06 LAB — MULTIPLE MYELOMA PANEL, SERUM
Albumin SerPl Elph-Mcnc: 3.6 g/dL (ref 2.9–4.4)
Albumin/Glob SerPl: 1.1 (ref 0.7–1.7)
Alpha 1: 0.2 g/dL (ref 0.0–0.4)
Alpha2 Glob SerPl Elph-Mcnc: 0.8 g/dL (ref 0.4–1.0)
B-Globulin SerPl Elph-Mcnc: 1.1 g/dL (ref 0.7–1.3)
Gamma Glob SerPl Elph-Mcnc: 1.4 g/dL (ref 0.4–1.8)
Globulin, Total: 3.5 g/dL (ref 2.2–3.9)
IgA: 153 mg/dL (ref 87–352)
IgG (Immunoglobin G), Serum: 1487 mg/dL (ref 586–1602)
IgM (Immunoglobulin M), Srm: 108 mg/dL (ref 26–217)
M Protein SerPl Elph-Mcnc: 0.5 g/dL — ABNORMAL HIGH
Total Protein ELP: 7.1 g/dL (ref 6.0–8.5)

## 2024-07-06 NOTE — Patient Instructions (Signed)
 It was great to see you today! Thank you for choosing [facility] for your primary care. Brandyn L Colberg was seen for BP.  Today we addressed: BP - Please take both your medications as prescribed. Follow up in 2 weeks to get blood work and recheck BP to see if we need to increase your medication  You should return to our clinic No follow-ups on file. Please arrive 15 minutes before your appointment to ensure smooth check in process.  We appreciate your efforts in making this happen.  Thank you for allowing me to participate in your care, Kathrine Melena, DO 07/06/2024, 9:48 AM PGY-2, [facility]

## 2024-07-07 ENCOUNTER — Ambulatory Visit: Admitting: Podiatry

## 2024-07-21 NOTE — Progress Notes (Deleted)
    SUBJECTIVE:   CHIEF COMPLAINT / HPI:   HTN - Good compliance with medications ***  PERTINENT  PMH / PSH: Menorrhagia, IDA, Stroke   OBJECTIVE:   There were no vitals taken for this visit.  General: Awake and Alert in NAD HEENT: NCAT. Sclera anicteric. No rhinorrhea. Cardiovascular: RRR. No M/R/G Respiratory: CTAB, normal WOB on RA. No wheezing, crackles, rhonchi, or diminished breath sounds. Abdomen: Soft, non-tender, non-distended. Bowel sounds normoactive/hypoactive/hyperactive. *** Extremities: Able to move all extremities. No BLE edema, no deformities or significant joint findings. Skin: Warm and dry. No abrasions or rashes noted. Neuro: A&Ox***. No focal neurological deficits.  SABRAbpmoni  ASSESSMENT/PLAN:   Assessment & Plan      Kathrine Melena, DO [facility]

## 2024-07-22 ENCOUNTER — Ambulatory Visit: Payer: Self-pay | Admitting: Family Medicine

## 2024-08-01 ENCOUNTER — Encounter (HOSPITAL_COMMUNITY): Payer: Self-pay

## 2024-08-01 ENCOUNTER — Ambulatory Visit (HOSPITAL_COMMUNITY)
Admission: RE | Admit: 2024-08-01 | Discharge: 2024-08-01 | Disposition: A | Discharge status: Home / Self Care | Source: Ambulatory Visit | Attending: Family Medicine | Admitting: Family Medicine

## 2024-08-01 ENCOUNTER — Telehealth: Payer: Self-pay | Admitting: Pharmacist

## 2024-08-01 ENCOUNTER — Other Ambulatory Visit: Payer: Self-pay

## 2024-08-01 VITALS — BP 161/113 | HR 80 | Temp 98.0°F | Resp 18

## 2024-08-01 DIAGNOSIS — L0390 Cellulitis, unspecified: Secondary | ICD-10-CM

## 2024-08-01 MED ORDER — CHLORHEXIDINE GLUCONATE 4 % EX SOLN: Freq: Every day | CUTANEOUS | 0 refills | Status: AC | PRN

## 2024-08-01 MED ORDER — KETOROLAC TROMETHAMINE 30 MG/ML IJ SOLN
30.0000 mg | Freq: Once | INTRAMUSCULAR | Status: AC
  Administered 2024-08-01: 30 mg via INTRAMUSCULAR

## 2024-08-01 MED ORDER — KETOROLAC TROMETHAMINE 30 MG/ML IJ SOLN
INTRAMUSCULAR | Status: AC
  Filled 2024-08-01: qty 1

## 2024-08-01 MED ORDER — DOXYCYCLINE HYCLATE 100 MG PO CAPS
100.0000 mg | ORAL_CAPSULE | Freq: Two times a day (BID) | ORAL | 0 refills | Status: AC
Start: 2024-08-01 — End: 2024-08-08

## 2024-08-01 MED ORDER — MUPIROCIN 2 % EX OINT: 1.0000 | TOPICAL_OINTMENT | Freq: Two times a day (BID) | CUTANEOUS | 0 refills | Status: AC

## 2024-08-01 MED ORDER — KETOROLAC TROMETHAMINE 10 MG PO TABS: 10.0000 mg | ORAL_TABLET | Freq: Four times a day (QID) | ORAL | 0 refills | Status: AC | PRN

## 2024-08-01 NOTE — ED Triage Notes (Addendum)
 C/O abscess to mid back x couple weeks; started becoming painful over past couple days. Denies fevers.  Also starting to notice right axillary abscess and chest abscess last night.

## 2024-08-01 NOTE — Telephone Encounter (Signed)
 Patient contacted for follow-up of blood pressure management device for patient home use.  Since last contact patient reports she has started a new job and apologized for missing last appointment. Blood pressure monitor for home use was prepared for her pick-up at that visit.   She has a visit today at urgent care - next door.    I contacted her to see if she was interested in free device.  She agreed to use AND stated she thought she could pick-up today prior to 5:00 PM appointment.   The patients has a diagnosis of hypertension and it is medically necessary for them to have access to a home device to monitor blood pressure.  The patient does not have readily available insurance access to a device and cannot afford to purchase a device at this time.  The patient has been counseled that they do not need to continue to receive services from [facility] to receive a device.  The patient will be given a device free of charge.  Placed monitor, batteries, and extra large cuff for patient pick-up at [facility] front desk.    I also re-scheduled follow-up with PCP for 08/22/2024  Total time with patient call and documentation of interaction: 12 minutes.

## 2024-08-01 NOTE — ED Provider Notes (Signed)
 [facility]    CSN: [id number] Arrival date & time: 08/01/24  1713      History   Chief Complaint Chief Complaint  Patient presents with   Abscess    Appt 1700    HPI Brittany Graves is a 38 y.o. female.    Abscess Here for several abscess/sites of skin infection.  One has been on her mid thoracic area. Also one in her right axilla, and 1 between her breasts. All were first noted about 2-3 weeks ago, when she had her last period. One on back has gotten more painful.  No f/c. NKDA  LMP 7/31   Past Medical History:  Diagnosis Date   Hypertension     Patient Active Problem List   Diagnosis Date Noted   BMI 40.0-44.9, adult (HCC) 12/22/2023   Abnormal SPEP 12/18/2023   Complex cyst of left ovary 10/02/2023   Breakthrough bleeding on depo provera  10/02/2023   ASCUS with positive high risk HPV cervical 08/20/2023   Chronic migraine w/o aura w/o status migrainosus, not intractable 08/18/2023   Intramural leiomyoma of uterus 08/13/2023   Dysmenorrhea 08/12/2023   Menorrhagia 07/09/2023   Work stress 07/09/2023   Iron  deficiency anemia 06/19/2023   Encounter for smoking cessation counseling 06/19/2023   HTN (hypertension) 06/18/2023   Headache 06/18/2023   Stroke (HCC) 06/18/2023   Weakness 06/18/2023    Past Surgical History:  Procedure Laterality Date   COLPOSCOPY  10/02/2023   ENDOMETRIAL BIOPSY  01/27/2024   TUBAL LIGATION      OB History     Gravida  3   Para  3   Term  3   Preterm      AB      Living  3      SAB      IAB      Ectopic      Multiple      Live Births  3            Home Medications    Prior to Admission medications   Medication Sig Start Date End Date Taking? Authorizing Provider  chlorhexidine  (HIBICLENS ) 4 % external liquid Apply topically daily as needed. 08/01/24  Yes Vonna Sharlet POUR, MD  doxycycline  (VIBRAMYCIN ) 100 MG capsule Take 1 capsule (100 mg total) by mouth 2 (two) times daily for 7  days. 08/01/24 08/08/24 Yes Vonna Sharlet POUR, MD  eplerenone  (INSPRA ) 25 MG tablet Take 1 tablet (25 mg total) by mouth daily. 03/22/24  Yes McDiarmid, Krystal BIRCH, MD  ketorolac  (TORADOL ) 10 MG tablet Take 1 tablet (10 mg total) by mouth every 6 (six) hours as needed (pain). 08/01/24  Yes Khilynn Borntreger K, MD  mupirocin  ointment (BACTROBAN ) 2 % Apply 1 Application topically 2 (two) times daily. To affected area till better 08/01/24  Yes Lynzie Cliburn, Sharlet POUR, MD  Olmesartan -amLODIPine -HCTZ 40-5-12.5 MG TABS Take 1 tablet by mouth daily. 07/04/24  Yes Janna Ferrier, DO    Family History Family History  Problem Relation Age of Onset   Hypertension Mother    Diabetes Mother    Heart disease Mother    Thyroid  disease Mother    Hypertension Father    Diabetes Father    Hypertension Sister    Thyroid  disease Sister     Social History Social History   Tobacco Use   Smoking status: Some Days    Types: Cigarettes   Smokeless tobacco: Never  Vaping Use   Vaping status: Never Used  Substance Use Topics   Alcohol use: Yes    Comment: socially   Drug use: Not Currently     Allergies   Patient has no known allergies.   Review of Systems Review of Systems   Physical Exam Triage Vital Signs ED Triage Vitals  Encounter Vitals Group     BP 08/01/24 1811 (!) 161/113     Girls Systolic BP Percentile --      Girls Diastolic BP Percentile --      Boys Systolic BP Percentile --      Boys Diastolic BP Percentile --      Pulse Rate 08/01/24 1811 80     Resp 08/01/24 1811 18     Temp 08/01/24 1811 98 F (36.7 C)     Temp Source 08/01/24 1811 Oral     SpO2 08/01/24 1811 98 %     Weight --      Height --      Head Circumference --      Peak Flow --      Pain Score 08/01/24 1812 9     Pain Loc --      Pain Education --      Exclude from Growth Chart --    No data found.  Updated Vital Signs BP (!) 161/113   Pulse 80   Temp 98 F (36.7 C) (Oral)   Resp 18   LMP 07/07/2024  (Approximate)   SpO2 98%   Visual Acuity Right Eye Distance:   Left Eye Distance:   Bilateral Distance:    Right Eye Near:   Left Eye Near:    Bilateral Near:     Physical Exam Vitals reviewed.  Constitutional:      General: She is not in acute distress.    Appearance: She is not ill-appearing, toxic-appearing or diaphoretic.  Cardiovascular:     Rate and Rhythm: Normal rate and regular rhythm.  Skin:    Coloration: Skin is not pale.     Comments: On her interscapular area there is an area of induration and tenderness and erythema, about 3 cm in diameter. No fluctuance.  There is a very tender area of induration in her right axilla, about 1 x 2.5 cm. No fluctuance at this time.  Also there is a small area of induration, about 0.5 cm in diameter on chest over sternum, between her breasts.  Neurological:     Mental Status: She is alert and oriented to person, place, and time.  Psychiatric:        Behavior: Behavior normal.      UC Treatments / Results  Labs (all labs ordered are listed, but only abnormal results are displayed) Labs Reviewed - No data to display  EKG   Radiology No results found.  Procedures Procedures (including critical care time)  Medications Ordered in UC Medications  ketorolac  (TORADOL ) 30 MG/ML injection 30 mg (has no administration in time range)    Initial Impression / Assessment and Plan / UC Course  I have reviewed the triage vital signs and the nursing notes.  Pertinent labs & imaging results that were available during my care of the patient were reviewed by me and considered in my medical decision making (see chart for details).     Doxycycline  is sent for the cellulitis. Toradol  injection is given here and tabs are sent to the pharmacy for pain.  Requests a wash and antibacterial ointment, so bactroban  and hibiclens  sent Final Clinical Impressions(s) / UC Diagnoses  Final diagnoses:  Cellulitis, unspecified cellulitis site      Discharge Instructions      You have been given a shot of Toradol  30 mg today.  Ketorolac  10 mg tablets--take 1 tablet every 6 hours as needed for pain.  This is the same medicine that is in the shot we just gave you  Take doxycycline  100 mg --1 capsule 2 times daily for 7 days  Put mupirocin  ointment on the sore areas twice daily until improved  Followup with your primary care about this issue     ED Prescriptions     Medication Sig Dispense Auth. Provider   ketorolac  (TORADOL ) 10 MG tablet Take 1 tablet (10 mg total) by mouth every 6 (six) hours as needed (pain). 20 tablet Anfernee Peschke, Sharlet POUR, MD   doxycycline  (VIBRAMYCIN ) 100 MG capsule Take 1 capsule (100 mg total) by mouth 2 (two) times daily for 7 days. 14 capsule Rakia Frayne K, MD   mupirocin  ointment (BACTROBAN ) 2 % Apply 1 Application topically 2 (two) times daily. To affected area till better 22 g Vonna Sharlet POUR, MD   chlorhexidine  (HIBICLENS ) 4 % external liquid Apply topically daily as needed. 120 mL Vonna Sharlet POUR, MD      PDMP not reviewed this encounter.   Vonna Sharlet POUR, MD 08/01/24 [phone]

## 2024-08-01 NOTE — Discharge Instructions (Addendum)
 You have been given a shot of Toradol  30 mg today.  Ketorolac  10 mg tablets--take 1 tablet every 6 hours as needed for pain.  This is the same medicine that is in the shot we just gave you  Take doxycycline  100 mg --1 capsule 2 times daily for 7 days  Put mupirocin  ointment on the sore areas twice daily until improved  Followup with your primary care about this issue

## 2024-08-02 NOTE — Telephone Encounter (Signed)
 Reviewed and agree with Dr Rennis plan.

## 2024-08-21 NOTE — Progress Notes (Deleted)
    SUBJECTIVE:   CHIEF COMPLAINT / HPI:   BP  PERTINENT  PMH / PSH: ***  OBJECTIVE:   LMP 07/07/2024 (Approximate)   ***  ASSESSMENT/PLAN:   Assessment & Plan Primary hypertension Patient's blood pressure {is/is not:320031} controlled today.  . Goal of {BPGOAL:29904}. Patient's medication regimen includes olmesartan -amlodipine -hydrochlorothiazide  40-5-12.5 mg daily and eplerenone  25 mg daily. - Changes to current regimen include *** - Referral to pharmacy clinic due to {KOVALBPCRITERIA:29903} - Labs: BMP, urine microalbumin/creatinine *** - BP log added to patient's AVS*** - Follow up in *** weeks     Kathrine Melena, DO [facility]

## 2024-08-21 NOTE — Assessment & Plan Note (Deleted)
 Patient's blood pressure {is/is not:320031} controlled today.  . Goal of {BPGOAL:29904}. Patient's medication regimen includes olmesartan -amlodipine -hydrochlorothiazide  40-5-12.5 mg daily and eplerenone  25 mg daily. - Changes to current regimen include *** - Referral to pharmacy clinic due to {KOVALBPCRITERIA:29903} - Labs: BMP, urine microalbumin/creatinine *** - BP log added to patient's AVS*** - Follow up in *** weeks

## 2024-08-22 ENCOUNTER — Encounter: Payer: Self-pay | Admitting: Obstetrics and Gynecology

## 2024-08-22 ENCOUNTER — Ambulatory Visit: Admitting: Family Medicine

## 2024-08-22 ENCOUNTER — Ambulatory Visit: Admitting: Obstetrics and Gynecology

## 2024-08-22 DIAGNOSIS — I10 Essential (primary) hypertension: Secondary | ICD-10-CM

## 2024-08-23 NOTE — Progress Notes (Signed)
 Patient did not keep her GYN appointment for 08/22/2024.  Bebe Izell Raddle MD Attending [facility] [employer] [facility])

## 2024-10-01 NOTE — Progress Notes (Deleted)
 [facility] OFFICE PROGRESS NOTE  Janna Ferrier, DO [address] [place] [address]  DIAGNOSIS:  1)  iron  deficiency anemia secondary to menorrhagia secondary to uterine fibroid. She has intolerance to the oral iron  tablets.  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   CURRENT THERAPY: IV iron  with Venofer  300 mg x3 last dose on 09/29/2023   INTERVAL HISTORY: Esti L Dobesh 38 y.o. female returns to the clinic today for a follow-up visit.  The patient establish care in the clinic with Dr. Sherrod on 09/09/2023 and was last seen in the clinic on 12/18/23.  The patient is followed for anemia secondary to menorrhagia secondary to uterine fibroids.  She has associated heavy clots with her menstrual periods.  She is followed closely by GYN and has previously received Depo-Provera  with less bleeding except she has been having intermittent vaginal bleeding since receiving her most recent depo shot in ***.  She was supposed to see GYN in September 2025 but missed her appointment.   ***HTN   Overall she states her energy is ***y.  She sometimes has a little bit of fatigue.  She is no*** longer craving ice chips.  Besides the menstrual bleeding she denies any other abnormal bleeding or bruising such as epistaxis, gingival bleeding, hemoptysis, hematemesis, melena, or hematochezia. The patient tried taking over-the-counter ferrous sulfate  in early July 2024 but she was not consistent with this due to GI upset.  She has been trying to increase her dietary intake of iron  rich food such as liver.  She is here for evaluation and repeat blood work.   MEDICAL HISTORY: Past Medical History:  Diagnosis Date   Hypertension     ALLERGIES:  has no known allergies.  MEDICATIONS:  Current Outpatient Medications  Medication Sig Dispense Refill   chlorhexidine  (HIBICLENS ) 4 % external liquid Apply topically daily as needed. 120  mL 0   eplerenone  (INSPRA ) 25 MG tablet Take 1 tablet (25 mg total) by mouth daily. 30 tablet 11   ketorolac  (TORADOL ) 10 MG tablet Take 1 tablet (10 mg total) by mouth every 6 (six) hours as needed (pain). 20 tablet 0   mupirocin  ointment (BACTROBAN ) 2 % Apply 1 Application topically 2 (two) times daily. To affected area till better 22 g 0   Olmesartan -amLODIPine -HCTZ 40-5-12.5 MG TABS Take 1 tablet by mouth daily. 30 tablet 2   No current facility-administered medications for this visit.    SURGICAL HISTORY:  Past Surgical History:  Procedure Laterality Date   COLPOSCOPY  10/02/2023   ENDOMETRIAL BIOPSY  01/27/2024   TUBAL LIGATION      REVIEW OF SYSTEMS:   Review of Systems  Constitutional: Negative for appetite change, chills, fatigue, fever and unexpected weight change.  HENT:   Negative for mouth sores, nosebleeds, sore throat and trouble swallowing.   Eyes: Negative for eye problems and icterus.  Respiratory: Negative for cough, hemoptysis, shortness of breath and wheezing.   Cardiovascular: Negative for chest pain and leg swelling.  Gastrointestinal: Negative for abdominal pain, constipation, diarrhea, nausea and vomiting.  Genitourinary: Negative for bladder incontinence, difficulty urinating, dysuria, frequency and hematuria.   Musculoskeletal: Negative for back pain, gait problem, neck pain and neck stiffness.  Skin: Negative for itching and rash.  Neurological: Negative for dizziness, extremity weakness, gait problem, headaches, light-headedness and seizures.  Hematological: Negative for adenopathy. Does not bruise/bleed easily.  Psychiatric/Behavioral: Negative for confusion, depression and sleep disturbance.  The patient is not nervous/anxious.     PHYSICAL EXAMINATION:  There were no vitals taken for this visit.  ECOG PERFORMANCE STATUS: {CHL ONC ECOG [id number]  Physical Exam  Constitutional: Oriented to person, place, and time and well-developed,  well-nourished, and in no distress. No distress.  HENT:  Head: Normocephalic and atraumatic.  Mouth/Throat: Oropharynx is clear and moist. No oropharyngeal exudate.  Eyes: Conjunctivae are normal. Right eye exhibits no discharge. Left eye exhibits no discharge. No scleral icterus.  Neck: Normal range of motion. Neck supple.  Cardiovascular: Normal rate, regular rhythm, normal heart sounds and intact distal pulses.   Pulmonary/Chest: Effort normal and breath sounds normal. No respiratory distress. No wheezes. No rales.  Abdominal: Soft. Bowel sounds are normal. Exhibits no distension and no mass. There is no tenderness.  Musculoskeletal: Normal range of motion. Exhibits no edema.  Lymphadenopathy:    No cervical adenopathy.  Neurological: Alert and oriented to person, place, and time. Exhibits normal muscle tone. Gait normal. Coordination normal.  Skin: Skin is warm and dry. No rash noted. Not diaphoretic. No erythema. No pallor.  Psychiatric: Mood, memory and judgment normal.  Vitals reviewed.  LABORATORY DATA: Lab Results  Component Value Date   WBC 6.0 07/04/2024   HGB 14.1 07/04/2024   HCT 41.5 07/04/2024   MCV 84.3 07/04/2024   PLT 249 07/04/2024      Chemistry      Component Value Date/Time   NA 141 02/18/2024 1531   NA 139 05/15/2012 0853   K 3.6 02/18/2024 1531   K 3.3 (L) 05/15/2012 0853   CL 101 02/18/2024 1531   CL 104 05/15/2012 0853   CO2 24 02/18/2024 1531   CO2 24 05/15/2012 0853   BUN 10 02/18/2024 1531   BUN 4 (L) 05/15/2012 0853   CREATININE 0.74 02/18/2024 1531   CREATININE 0.55 09/09/2023 1147   CREATININE 0.73 05/15/2012 0853      Component Value Date/Time   CALCIUM 9.8 02/18/2024 1531   CALCIUM 9.0 05/15/2012 0853   ALKPHOS 68 12/21/2023 1720   AST 21 12/21/2023 1720   AST 12 (L) 09/09/2023 1147   ALT 19 12/21/2023 1720   ALT 12 09/09/2023 1147   BILITOT 0.6 12/21/2023 1720   BILITOT 0.3 09/09/2023 1147       RADIOGRAPHIC STUDIES:  No  results found.   ASSESSMENT/PLAN:  This is a very pleasant 38 year old African-American female with iron  deficiency anemia secondary to menorrhagia secondary to uterine fibroids.  She has intolerance to oral iron  supplements.   She has received IV iron  in the past with Venofer  300 mg weekly x 3.  Her most recent dose has been on 09/29/23   The patient had a repeat CBC, CMP, iron  studies, and ferritin drawn today.  Labs today show CBC is within normal limits and continues to improve. Her other lab studies are pending at this time. My feeling based on her current labs are that she will not require one at this time but I will let her know once I have her remaining lab studies.    We will see her back for follow-up visit in 3-4 months for evaluation and repeat blood work.    She will continue to follow with GYN regarding her heavy menstrual cycles. I asked that she let them know that she has been having intermittent vaginal bleeding since she was last seen after her last Depo shot and endometrial biopsy.    She knows if she ever has signs  and symptoms of worsening anemia such as fatigue and craving ice chips, that she is always free to call us  sooner for repeat blood work.  The patient was advised to call immediately if she has any concerning symptoms in the interval. The patient voices understanding of current disease status and treatment options and is in agreement with the current care plan. All questions were answered. The patient knows to call the clinic with any problems, questions or concerns. We can certainly see the patient much sooner if necessary   No orders of the defined types were placed in this encounter.    I spent {CHL ONC TIME VISIT - DTPQU:[id number]} counseling the patient face to face. The total time spent in the appointment was {CHL ONC TIME VISIT - DTPQU:[id number]}.  Barnaby Rippeon L Allisen Pidgeon, PA-C 10/01/24

## 2024-10-04 ENCOUNTER — Telehealth: Payer: Self-pay

## 2024-10-04 ENCOUNTER — Other Ambulatory Visit

## 2024-10-04 ENCOUNTER — Ambulatory Visit: Admitting: Internal Medicine

## 2024-10-04 ENCOUNTER — Ambulatory Visit: Admitting: Physician Assistant

## 2024-10-04 ENCOUNTER — Inpatient Hospital Stay: Attending: Family Medicine

## 2024-10-04 NOTE — Telephone Encounter (Signed)
 Tried to reach patient in regards to appts today.  LVM to return call to reschedule.
